# Patient Record
Sex: Female | Born: 1950 | Race: White | Hispanic: No | Smoking: Never smoker
Health system: Southern US, Community
[De-identification: ages and names within clinical notes are randomized; demographics above are authoritative.]

## PROBLEM LIST (undated history)

## (undated) DIAGNOSIS — Z5189 Encounter for other specified aftercare: Secondary | ICD-10-CM

## (undated) DIAGNOSIS — E669 Obesity, unspecified: Secondary | ICD-10-CM

## (undated) DIAGNOSIS — K635 Polyp of colon: Secondary | ICD-10-CM

## (undated) DIAGNOSIS — I10 Essential (primary) hypertension: Secondary | ICD-10-CM

## (undated) DIAGNOSIS — E785 Hyperlipidemia, unspecified: Secondary | ICD-10-CM

## (undated) DIAGNOSIS — H409 Unspecified glaucoma: Secondary | ICD-10-CM

## (undated) DIAGNOSIS — B1910 Unspecified viral hepatitis B without hepatic coma: Secondary | ICD-10-CM

## (undated) DIAGNOSIS — K219 Gastro-esophageal reflux disease without esophagitis: Secondary | ICD-10-CM

## (undated) DIAGNOSIS — F32A Depression, unspecified: Secondary | ICD-10-CM

## (undated) DIAGNOSIS — F329 Major depressive disorder, single episode, unspecified: Secondary | ICD-10-CM

## (undated) DIAGNOSIS — F419 Anxiety disorder, unspecified: Secondary | ICD-10-CM

## (undated) DIAGNOSIS — R197 Diarrhea, unspecified: Secondary | ICD-10-CM

## (undated) HISTORY — PX: ABDOMINAL HYSTERECTOMY: SHX81

## (undated) HISTORY — PX: APPENDECTOMY: SHX54

## (undated) HISTORY — DX: Gastro-esophageal reflux disease without esophagitis: K21.9

## (undated) HISTORY — DX: Encounter for other specified aftercare: Z51.89

## (undated) HISTORY — PX: CHOLECYSTECTOMY: SHX55

## (undated) HISTORY — PX: KNEE SURGERY: SHX244

## (undated) HISTORY — DX: Diarrhea, unspecified: R19.7

## (undated) HISTORY — DX: Hyperlipidemia, unspecified: E78.5

## (undated) HISTORY — PX: NECK SURGERY: SHX720

## (undated) HISTORY — DX: Polyp of colon: K63.5

## (undated) HISTORY — DX: Anxiety disorder, unspecified: F41.9

## (undated) HISTORY — DX: Obesity, unspecified: E66.9

## (undated) HISTORY — PX: ANKLE SURGERY: SHX546

## (undated) HISTORY — DX: Unspecified glaucoma: H40.9

## (undated) HISTORY — PX: TONSILLECTOMY: SUR1361

## (undated) HISTORY — PX: COLONOSCOPY: SHX174

## (undated) HISTORY — PX: CATARACT EXTRACTION W/ INTRAOCULAR LENS IMPLANT: SHX1309

## (undated) HISTORY — DX: Essential (primary) hypertension: I10

---

## 1976-08-06 DIAGNOSIS — Z5189 Encounter for other specified aftercare: Secondary | ICD-10-CM

## 1976-08-06 HISTORY — DX: Encounter for other specified aftercare: Z51.89

## 1998-12-27 ENCOUNTER — Encounter: Payer: Self-pay | Admitting: Internal Medicine

## 1998-12-27 ENCOUNTER — Ambulatory Visit (HOSPITAL_COMMUNITY): Admission: RE | Admit: 1998-12-27 | Discharge: 1998-12-27 | Payer: Self-pay | Admitting: Internal Medicine

## 1999-06-10 ENCOUNTER — Ambulatory Visit (HOSPITAL_COMMUNITY): Admission: RE | Admit: 1999-06-10 | Discharge: 1999-06-10 | Payer: Self-pay | Admitting: Orthopedic Surgery

## 1999-06-10 ENCOUNTER — Encounter: Payer: Self-pay | Admitting: Orthopedic Surgery

## 1999-06-26 ENCOUNTER — Ambulatory Visit (HOSPITAL_COMMUNITY): Admission: RE | Admit: 1999-06-26 | Discharge: 1999-06-27 | Payer: Self-pay | Admitting: *Deleted

## 1999-06-26 ENCOUNTER — Encounter: Payer: Self-pay | Admitting: *Deleted

## 1999-06-27 ENCOUNTER — Encounter: Payer: Self-pay | Admitting: *Deleted

## 1999-08-29 ENCOUNTER — Ambulatory Visit (HOSPITAL_COMMUNITY): Admission: RE | Admit: 1999-08-29 | Discharge: 1999-08-29 | Payer: Self-pay | Admitting: *Deleted

## 1999-08-29 ENCOUNTER — Encounter: Payer: Self-pay | Admitting: *Deleted

## 2002-01-19 ENCOUNTER — Encounter: Payer: Self-pay | Admitting: Internal Medicine

## 2002-01-19 ENCOUNTER — Ambulatory Visit (HOSPITAL_COMMUNITY): Admission: RE | Admit: 2002-01-19 | Discharge: 2002-01-19 | Payer: Self-pay | Admitting: Internal Medicine

## 2004-05-15 ENCOUNTER — Emergency Department (HOSPITAL_COMMUNITY): Admission: EM | Admit: 2004-05-15 | Discharge: 2004-05-15 | Payer: Self-pay | Admitting: Emergency Medicine

## 2004-11-06 ENCOUNTER — Ambulatory Visit (HOSPITAL_COMMUNITY): Admission: RE | Admit: 2004-11-06 | Discharge: 2004-11-06 | Payer: Self-pay | Admitting: Internal Medicine

## 2005-01-10 ENCOUNTER — Ambulatory Visit (HOSPITAL_COMMUNITY): Admission: RE | Admit: 2005-01-10 | Discharge: 2005-01-10 | Payer: Self-pay | Admitting: Internal Medicine

## 2005-01-12 ENCOUNTER — Ambulatory Visit (HOSPITAL_COMMUNITY): Admission: RE | Admit: 2005-01-12 | Discharge: 2005-01-12 | Payer: Self-pay | Admitting: Internal Medicine

## 2005-02-21 ENCOUNTER — Emergency Department (HOSPITAL_COMMUNITY): Admission: EM | Admit: 2005-02-21 | Discharge: 2005-02-21 | Payer: Self-pay | Admitting: Emergency Medicine

## 2005-02-28 ENCOUNTER — Encounter (INDEPENDENT_AMBULATORY_CARE_PROVIDER_SITE_OTHER): Payer: Self-pay | Admitting: Specialist

## 2005-02-28 ENCOUNTER — Observation Stay (HOSPITAL_COMMUNITY): Admission: RE | Admit: 2005-02-28 | Discharge: 2005-03-01 | Payer: Self-pay | Admitting: General Surgery

## 2005-09-25 ENCOUNTER — Ambulatory Visit (HOSPITAL_BASED_OUTPATIENT_CLINIC_OR_DEPARTMENT_OTHER): Admission: RE | Admit: 2005-09-25 | Discharge: 2005-09-25 | Payer: Self-pay | Admitting: Orthopedic Surgery

## 2005-10-18 ENCOUNTER — Ambulatory Visit (HOSPITAL_BASED_OUTPATIENT_CLINIC_OR_DEPARTMENT_OTHER): Admission: RE | Admit: 2005-10-18 | Discharge: 2005-10-18 | Payer: Self-pay | Admitting: Orthopedic Surgery

## 2005-12-05 ENCOUNTER — Encounter: Payer: Self-pay | Admitting: *Deleted

## 2006-08-12 ENCOUNTER — Other Ambulatory Visit: Admission: RE | Admit: 2006-08-12 | Discharge: 2006-08-12 | Payer: Self-pay | Admitting: Internal Medicine

## 2006-08-30 ENCOUNTER — Encounter: Admission: RE | Admit: 2006-08-30 | Discharge: 2006-08-30 | Payer: Self-pay | Admitting: Internal Medicine

## 2006-10-08 ENCOUNTER — Ambulatory Visit (HOSPITAL_COMMUNITY): Admission: RE | Admit: 2006-10-08 | Discharge: 2006-10-08 | Payer: Self-pay | Admitting: Internal Medicine

## 2007-03-07 ENCOUNTER — Emergency Department (HOSPITAL_COMMUNITY): Admission: EM | Admit: 2007-03-07 | Discharge: 2007-03-07 | Payer: Self-pay | Admitting: Emergency Medicine

## 2007-11-25 ENCOUNTER — Ambulatory Visit: Payer: Self-pay | Admitting: Gastroenterology

## 2007-11-25 LAB — CONVERTED CEMR LAB
ALT: 67 units/L — ABNORMAL HIGH (ref 0–35)
AST: 53 units/L — ABNORMAL HIGH (ref 0–37)
Albumin: 3.9 g/dL (ref 3.5–5.2)
Alkaline Phosphatase: 74 units/L (ref 39–117)
BUN: 14 mg/dL (ref 6–23)
Basophils Absolute: 0.1 10*3/uL (ref 0.0–0.1)
Basophils Relative: 1.3 % — ABNORMAL HIGH (ref 0.0–1.0)
Bilirubin, Direct: 0.1 mg/dL (ref 0.0–0.3)
CO2: 30 meq/L (ref 19–32)
Calcium: 9.7 mg/dL (ref 8.4–10.5)
Chloride: 100 meq/L (ref 96–112)
Creatinine, Ser: 0.9 mg/dL (ref 0.4–1.2)
Eosinophils Absolute: 0.2 10*3/uL (ref 0.0–0.7)
Eosinophils Relative: 1.9 % (ref 0.0–5.0)
GFR calc Af Amer: 83 mL/min
GFR calc non Af Amer: 69 mL/min
Glucose, Bld: 195 mg/dL — ABNORMAL HIGH (ref 70–99)
HCT: 40.2 % (ref 36.0–46.0)
Hemoglobin: 13.4 g/dL (ref 12.0–15.0)
Lymphocytes Relative: 39.5 % (ref 12.0–46.0)
MCHC: 33.3 g/dL (ref 30.0–36.0)
MCV: 85.8 fL (ref 78.0–100.0)
Monocytes Absolute: 0.6 10*3/uL (ref 0.1–1.0)
Monocytes Relative: 5.3 % (ref 3.0–12.0)
Neutro Abs: 5.6 10*3/uL (ref 1.4–7.7)
Neutrophils Relative %: 52 % (ref 43.0–77.0)
Platelets: 307 10*3/uL (ref 150–400)
Potassium: 3.9 meq/L (ref 3.5–5.1)
RBC: 4.69 M/uL (ref 3.87–5.11)
RDW: 13.3 % (ref 11.5–14.6)
Sodium: 139 meq/L (ref 135–145)
TSH: 2.14 microintl units/mL (ref 0.35–5.50)
Total Bilirubin: 0.5 mg/dL (ref 0.3–1.2)
Total Protein: 7.3 g/dL (ref 6.0–8.3)
WBC: 10.8 10*3/uL — ABNORMAL HIGH (ref 4.5–10.5)

## 2007-12-09 ENCOUNTER — Encounter: Payer: Self-pay | Admitting: Gastroenterology

## 2007-12-09 ENCOUNTER — Ambulatory Visit: Payer: Self-pay | Admitting: Gastroenterology

## 2007-12-09 LAB — CONVERTED CEMR LAB
A-1 Antitrypsin, Ser: 119 mg/dL (ref 83–200)
ALT: 82 units/L — ABNORMAL HIGH (ref 0–35)
AST: 83 units/L — ABNORMAL HIGH (ref 0–37)
Albumin: 3.7 g/dL (ref 3.5–5.2)
Alkaline Phosphatase: 78 units/L (ref 39–117)
Anti Nuclear Antibody(ANA): NEGATIVE
Bilirubin, Direct: 0.2 mg/dL (ref 0.0–0.3)
Ceruloplasmin: 37 mg/dL (ref 21–63)
Ferritin: 40.3 ng/mL (ref 10.0–291.0)
HCV Ab: NEGATIVE
Hep A Total Ab: NEGATIVE
Hep B S Ab: POSITIVE — AB
Hepatitis B Surface Ag: NEGATIVE
INR: 1.1 — ABNORMAL HIGH (ref 0.8–1.0)
Iron: 55 ug/dL (ref 42–145)
Prothrombin Time: 12.6 s (ref 10.9–13.3)
Saturation Ratios: 12.9 % — ABNORMAL LOW (ref 20.0–50.0)
Total Bilirubin: 0.8 mg/dL (ref 0.3–1.2)
Total Protein: 6.8 g/dL (ref 6.0–8.3)
Transferrin: 304.1 mg/dL (ref >=212.0)

## 2007-12-12 ENCOUNTER — Encounter: Payer: Self-pay | Admitting: Gastroenterology

## 2007-12-15 ENCOUNTER — Encounter: Payer: Self-pay | Admitting: Gastroenterology

## 2007-12-23 ENCOUNTER — Ambulatory Visit (HOSPITAL_COMMUNITY): Admission: RE | Admit: 2007-12-23 | Discharge: 2007-12-23 | Payer: Self-pay | Admitting: Gastroenterology

## 2008-01-30 ENCOUNTER — Telehealth (INDEPENDENT_AMBULATORY_CARE_PROVIDER_SITE_OTHER): Payer: Self-pay | Admitting: *Deleted

## 2008-10-25 ENCOUNTER — Encounter: Admission: RE | Admit: 2008-10-25 | Discharge: 2009-01-23 | Payer: Self-pay | Admitting: Internal Medicine

## 2008-12-17 IMAGING — US US ABDOMEN COMPLETE
1 series · 14 of 25 positions shown · non-contrast
Comparison: The CT abdomen 01/12/2005 the

CLINICAL DATA: Abnormal liver function test

ABDOMEN ULTRASOUND
TECHNIQUE: Complete abdominal ultrasound examination was performed
including evaluation of the liver, gallbladder, bile ducts,
pancreas, kidneys, spleen, IVC, and abdominal aorta.

[Series 1: unknown · 0.33mm/px · 14 of 51 slices shown]
[im 1/51]
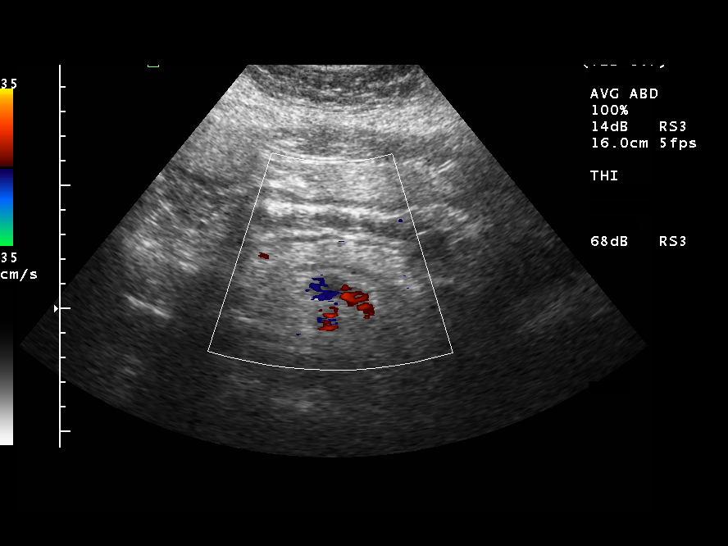
[im 5/51]
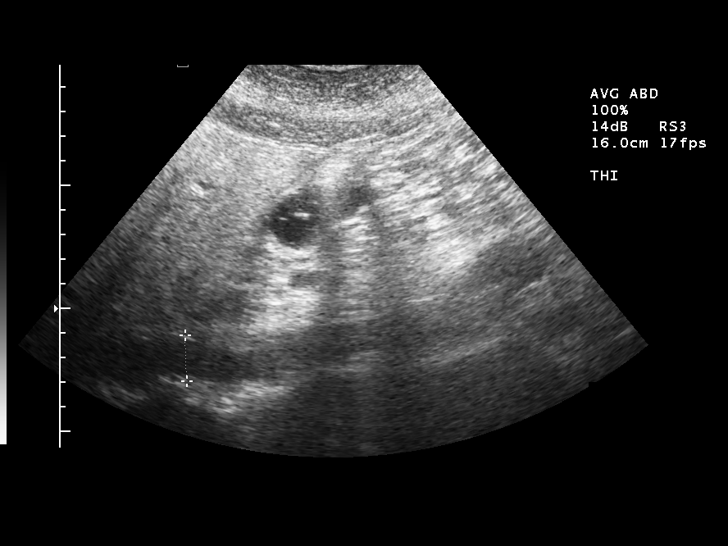
[im 9/51]
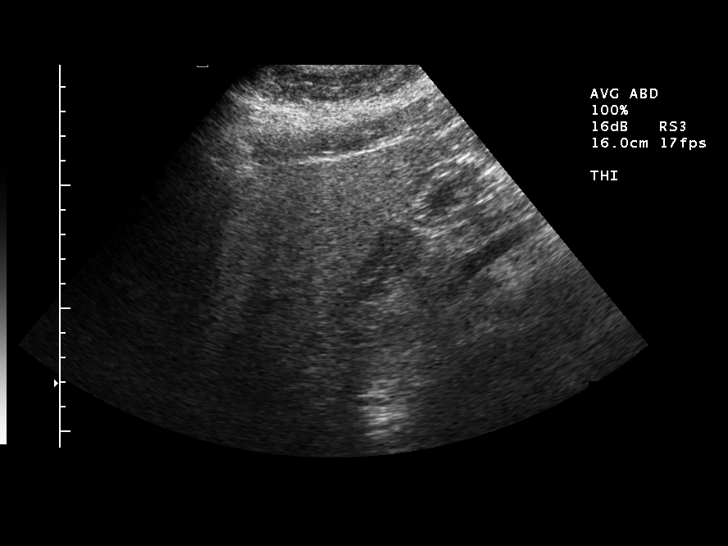
[im 13/51]
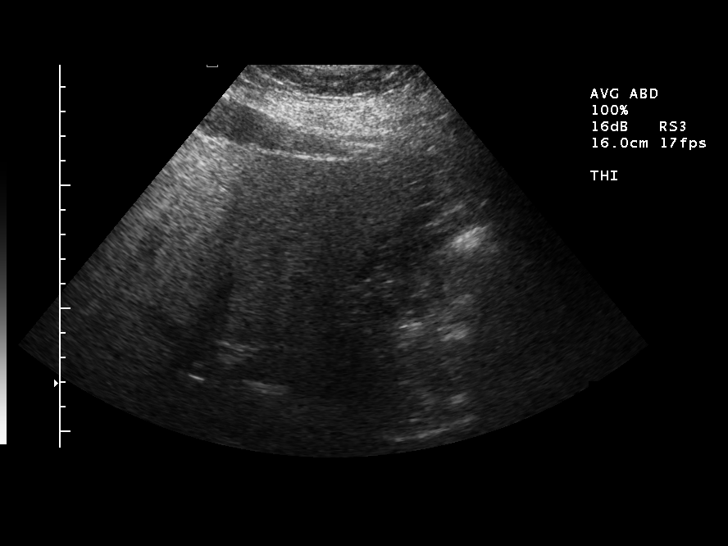
[im 17/51]
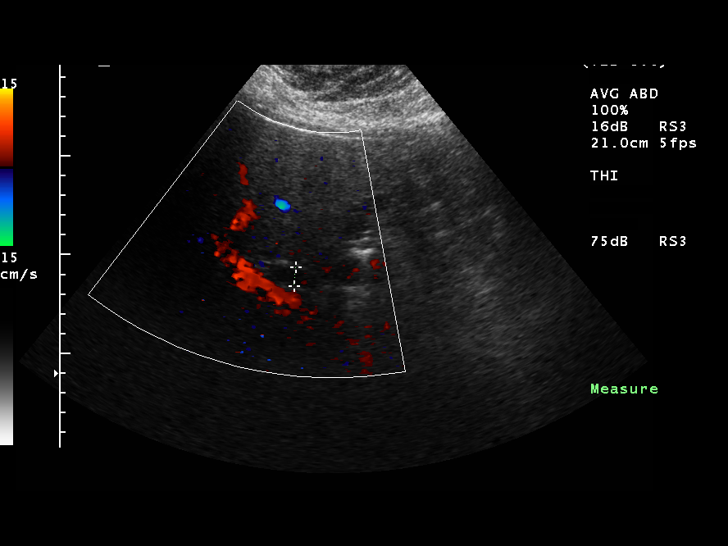
[im 19/51]
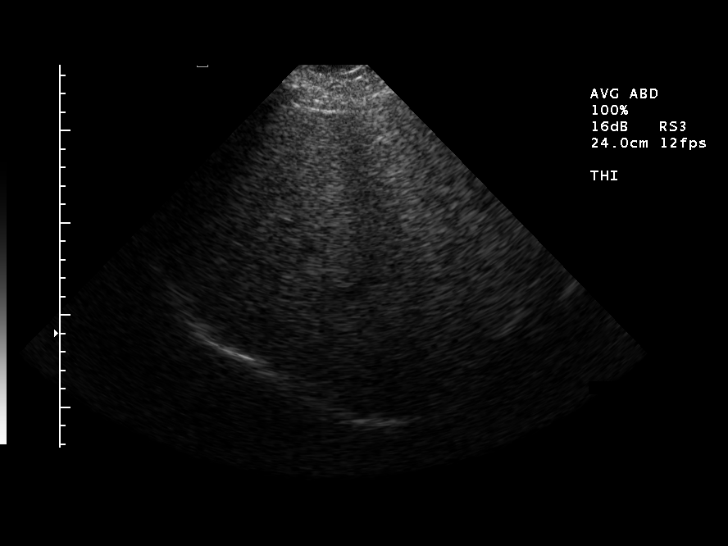
[im 23/51]
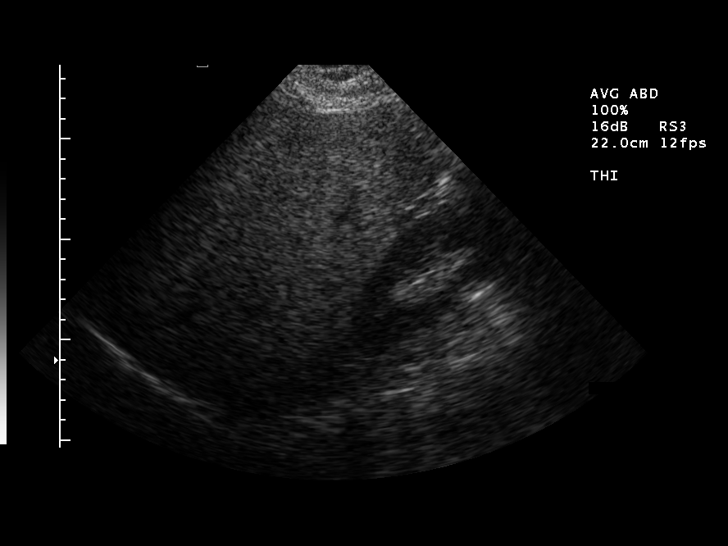
[im 28/51]
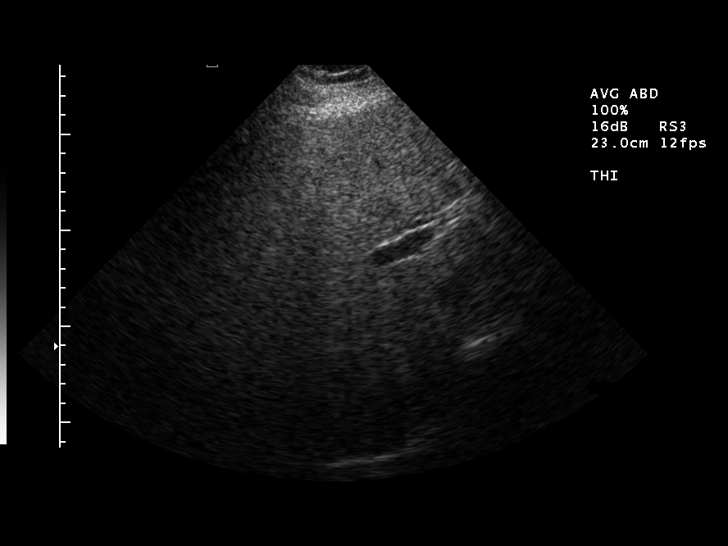
[im 32/51]
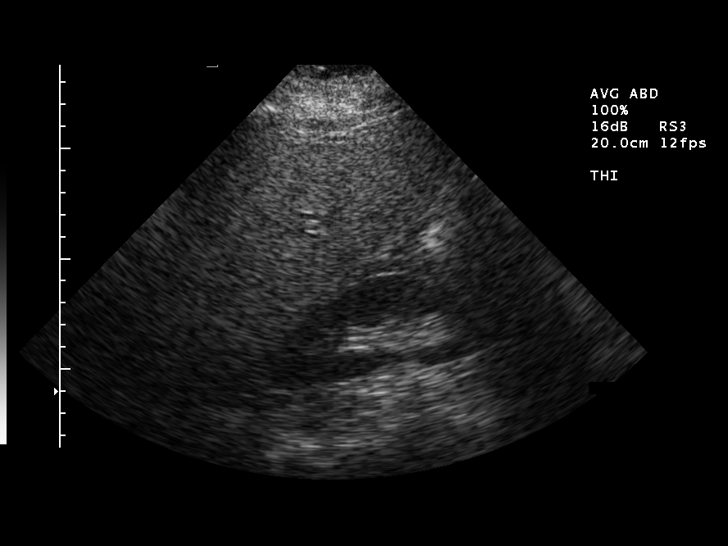
[im 34/51]
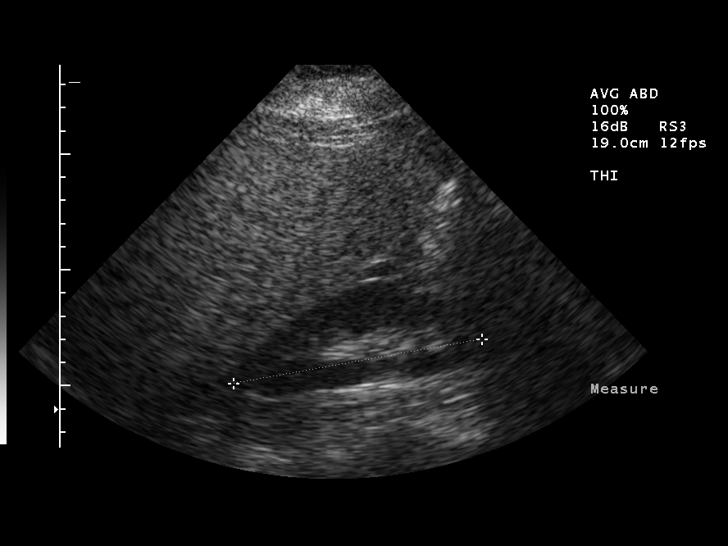
[im 38/51]
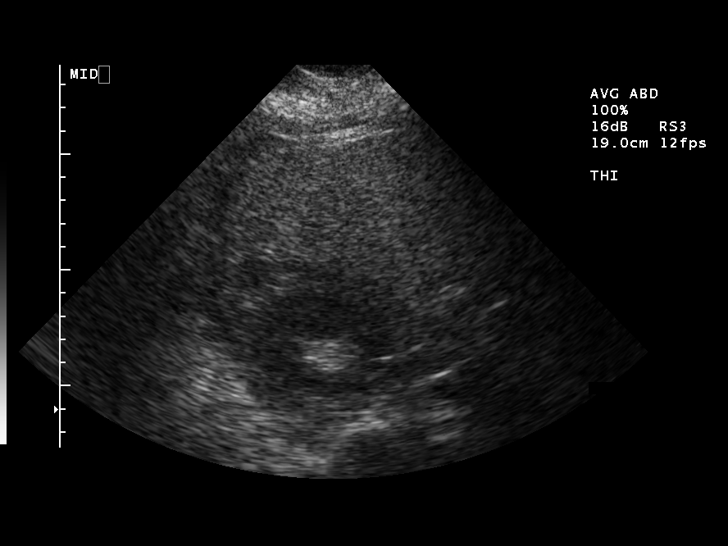
[im 42/51]
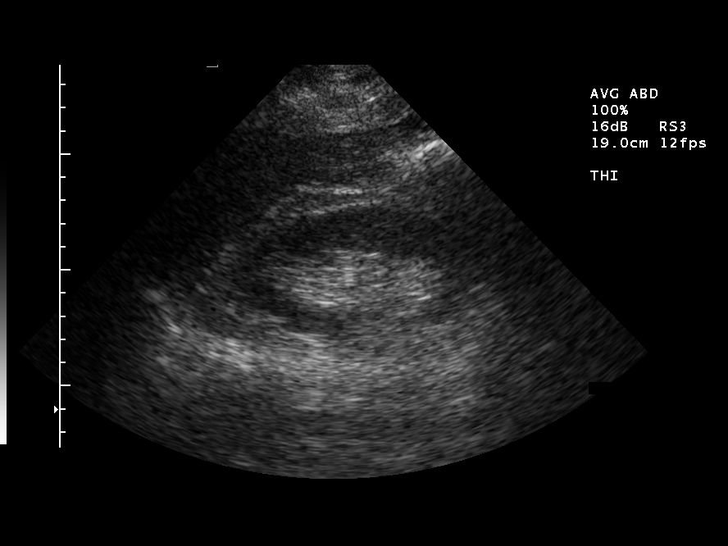
[im 46/51]
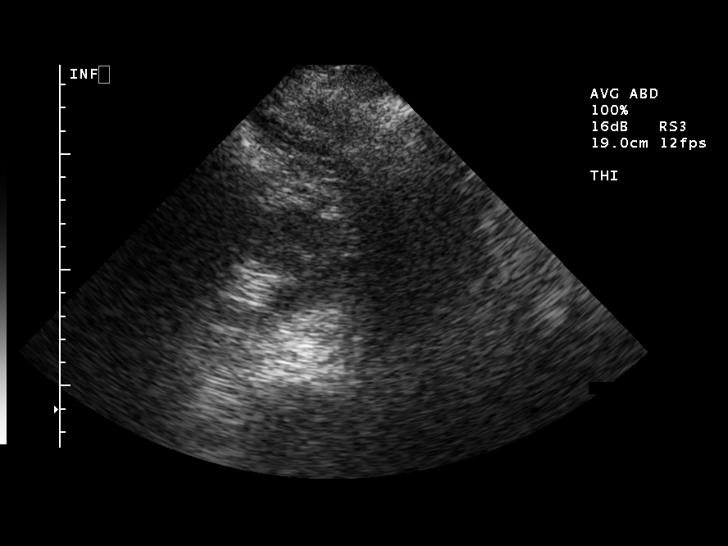
[im 51/51]
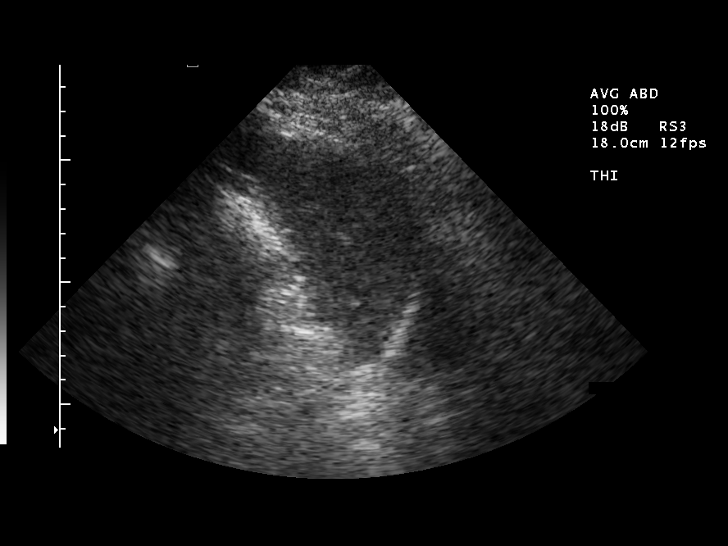

[14 of 25 positions shown; findings below may reference images not displayed]

FINDINGS: Liver demonstrates diffuse increased echogenicity.  No
focal mass identified.  The gallbladder is absent.  Common bile
duct measures 9.6 cm.

The IVC is normal.

The pancreas has no focal mass.  The pancreatic duct measures 3 mm.

Spleen is normal 10.1 cm.

Right kidney measures 10.9 cm.  Left kidney measures 11.1 cm.  No
evidence of hydronephrosis.

Abdominal aorta normal diameter 1.9 cm
IMPRESSION: 1.  Diffusely increased liver echogenicity commonly represents
hepatic steotosis versus hepatocelluar disease.

## 2009-03-24 ENCOUNTER — Ambulatory Visit (HOSPITAL_COMMUNITY): Admission: RE | Admit: 2009-03-24 | Discharge: 2009-03-24 | Payer: Self-pay | Admitting: Internal Medicine

## 2010-03-01 ENCOUNTER — Encounter
Admission: RE | Admit: 2010-03-01 | Discharge: 2010-05-04 | Payer: Self-pay | Source: Home / Self Care | Admitting: Internal Medicine

## 2010-06-01 ENCOUNTER — Emergency Department (HOSPITAL_COMMUNITY): Admission: EM | Admit: 2010-06-01 | Discharge: 2010-06-02 | Payer: Self-pay | Admitting: Emergency Medicine

## 2010-08-27 ENCOUNTER — Encounter: Payer: Self-pay | Admitting: Gastroenterology

## 2010-12-19 NOTE — Assessment & Plan Note (Signed)
[facility]                         GASTROENTEROLOGY OFFICE NOTE   CHRISTEN, WARDROP                          MRN:          [id number]  DATE:11/25/2007                            DOB:          [birth year]    REASON FOR REFERRAL:  Chronic diarrhea.   Ms. Dlugosz is a very pleasant 60 year old diabetic woman who has had at  least 2-3 years of chronic loose stools.  Shortly after her gallbladder  was removed laparoscopically for biliary colic and gallstones as the  time that her loose stools began.  She feels that they are generally  green, usually very loose and watery.  She only very intermittently sees  a small amount of blood on the tissue paper.  She has not had nocturnal  diarrhea but has had fecal incontinence on more than 1 occasion.  She  said Imodium works when she takes it and those are only on particularly  bad loose days.   She had recent blood tests performed showing an essentially normal CBC  except for white count of 12,000.  Her microalbumin was 7 which is much  higher than normal.  Complete metabolic profile was normal except for a  slightly elevated transaminases.  Liver testing were  otherwise normal.  Her AST was 38, her ALT was 53, TSH was normal and her hemoglobin A1c  was over 10.  She had never had colonoscopy.   REVIEW OF SYSTEMS:  Notable for a 15 pound weight gain in the past year.   PAST MEDICAL HISTORY:  1. Diabetes requiring insulin.  2. Hypertension.  3. Elevated cholesterol.  4. Status post lap chole 2006.  5. Status post hysterectomy in the 1980s.   CURRENT MEDICINES:  Fortamet, pravastatin, Hyzaar, Humulin, Lantus,  aspirin, vitamin C, multivitamin, MegaRed Omega, vitamin D.   ALLERGIES:  No known drug allergies.   SOCIAL HISTORY:  Married with 2 children.  Nonsmoker, nondrinker.  Drinks 3-4 caffeinated beverages a day, coffee in the morning.   FAMILY HISTORY:  Most of her family has diabetes.  No colon cancer  in  her family.   PHYSICAL EXAMINATION:  Height 5 feet 3 inches, 244 pounds.  Blood  pressure 128/74.  Pulse 64.  CONSTITUTIONAL:  Morbidly obese otherwise well-appearing.  NEUROLOGIC:  Alert and oriented x3.  EYES:  Extraocular movements intact.  MOUTH:  Oropharynx moist, no lesions.  NECK:  No lymphadenopathy.  CARDIOVASCULAR:  Heart regular rate and rhythm.  LUNGS:  Clear to auscultation bilaterally.  ABDOMEN:  Soft, nontender, nondistended.  Normal bowel sounds.  EXTREMITIES:  No lower extremity edema.  SKIN:  No rashes or lesion of visible extremities.   ASSESSMENT AND PLAN:  A 60 year old poorly controlled diabetic status  post laparoscopic cholecystectomy in 2006 with chronic loose stools.   She points to the day of her cholecystectomy as the time that her loose  stools began.  This is not an uncommon side effect of having one's  gallbladder removed.  Cholestyramine is usually very effective at  controlling this type of diarrhea.  Diabetics also can have a relative  neuropathic diarrhea.  They can also have small bowel bacterial  overgrowth which can cause diarrhea.  She could certainly have an  inflammatory or neoplastic process contributing or causing her diarrhea  but I think those are unlikely.  My plan is to proceed with full  colonoscopy at her earliest convenience, also repeat some blood tests  including CBC, complete metabolic profile.  If colonoscopy is  essentially normal then I will likely get her started on a  cholestyramine trial.     Rachael Fee, MD  Electronically Signed    DPJ/MedQ  DD: 11/25/2007  DT: 11/25/2007  Job #: [id number]   cc:   Lucky Cowboy, M.D.

## 2010-12-22 NOTE — Op Note (Signed)
NAME:  Ellen Mccoy, Ellen Mccoy NO.:  [id number]   MEDICAL RECORD NO.:  [id number]          PATIENT TYPE:  AMB   LOCATION:  DAY                          FACILITY:  [facility]   PHYSICIAN:  Gita Kudo, M.D. DATE OF BIRTH:  1950/10/18   DATE OF PROCEDURE:  02/28/2005  DATE OF DISCHARGE:                                 OPERATIVE REPORT   OPERATIVE PROCEDURE:  Laparoscopic cholecystectomy.   SURGEON:  Gita Kudo, M.D.   ASSISTANT:  Angelia Mould. Derrell Lolling, M.D.   ANESTHESIA:  General endotracheal.   PREOPERATIVE DIAGNOSIS:  Gallstones.   POSTOPERATIVE DIAGNOSIS:  Gallstones, normal cholangiogram.   CLINICAL SUMMARY:  A 60 year old overweight diabetic lady with gallstones  and normal liver function studies brought in for elective cholecystectomy.   OPERATIVE FINDINGS:  The gallbladder was thin walled. The anatomy of the  duct and artery were normal. Cholangiogram looked normal.   OPERATIVE PROCEDURE:  Under satisfactory general endotracheal anesthesia,  having received 1.0 grams Ancef preop, the patient was positioned, prepped  and draped in the standard fashion. A total of 28 mL of 0.5% Marcaine with  epinephrine was infiltrated at the skin incision sites for postop analgesia.  Transverse incision made above the umbilicus, midline opened into the  peritoneum and controlled with a figure-of-eight 0 Vicryl suture. Operating  Hussan  port inserted, secured and good CO2 pneumoperitoneum established.  Under direct vision, two #5 ports placed laterally and a second #10  medially. Lateral graspers gave excellent exposure and operating through the  medial port, we took down the adhesions to the gallbladder,  circumferentially dissected the cystic duct and cystic artery. When certain  of the anatomy, the artery was divided between multiple clips and a single  clip placed on the duct near the gallbladder. Incision made in the duct,  percutaneous catheter placed and  cholangiogram obtained. It looked good  without filling defects or obstruction. Then the gallbladder was dissected  from the liver bed from below upward. Cautery was used for hemostasis and  dissection. After the gallbladder was amputated, a grasper was placed  through the umbilicus and the camera moved to the upper port and the  gallbladder removed, intact and without spillage or complication. The  operative site was lavaged with saline, made hemostatic by cautery and then  suctioned  dry. The ports and CO2 were released. Following this, the midline closed  with a previous figure-of-eight and a second 0 Vicryl suture. Subcu  approximated with 4-0 Vicryl and Steri-Strips approximated the skin. There  were no complications and the sponge and needle counts were correct.       MRL/MEDQ  D:  02/28/2005  T:  02/28/2005  Job:  [id number]   cc:   Lucky Cowboy, M.D.  [address]  [place], [place] [address]  Fax: [phone]

## 2010-12-22 NOTE — Op Note (Signed)
NAME:  Ellen Mccoy, ARRIGHI NO.:  [id number]   MEDICAL RECORD NO.:  [id number]          PATIENT TYPE:  AMB   LOCATION:  DSC                          FACILITY:  [facility]   PHYSICIAN:  Katy Fitch. Sypher, M.D. 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   OPERATIONS:  Release of left transverse carpal ligament.   OPERATING SURGEON:  Molly Maduro Sypher   ASSISTANT:  Molly Maduro Dasnoit PA-C.   ANESTHESIA:  General by LMA.   SUPERVISING ANESTHESIOLOGIST:  Dr. Gelene Mink.   INDICATIONS:  [name] is a 60 year old woman referred through the  courtesy of Dr. Oneta Rack for evaluation and management of bilateral hand  numbness. Clinical examination suggested carpal tunnel syndrome.   Electrodiagnostic studies completed by Dr. Wadie Lessen revealed significant  bilateral carpal tunnel syndrome, worse on the left than the right.   After informed consent at the office, she is brought to the operating at  this time for release of her left transverse carpal ligament.   PROCEDURE:  [name] was brought to the operating room and placed in  supine position on the table.   Following the induction of general anesthesia by LMA technique, the left arm  was prepped with Betadine soap solution, sterilely draped.   Following exsanguination of left arm with an Esmarch bandage, an arterial  tourniquet proximal brachium inflated to 220 mmHg. During the course of  procedure, her systolic blood pressure increased to over 160 mmHg. Therefore  the tourniquet was ultimately elevated to 270 mmHg.   Arsenio Loader was brought to operating room and placed in supine position on  the table.   Following induction general anesthesia by LMA technique, the left arm  was  prepped with Betadine soap and solution, sterilely draped. Following  exsanguination of left arm with Esmarch bandage, the arterial time was  inflated to 220 mmHg and later elevated as mentioned to 270 mmHg.   Procedure commenced with a short incision in the line of the ring finger in  the palm. The subcutaneous tissues were carefully divided to reveal the  palmar fascia. This was split longitudinally to reveal the common sensory  branch of median nerve. These were followed back to transverse carpal  ligament proper which was carefully isolated from the median nerve with a  Insurance risk surveyor.   Ligament released along its ulnar border extending into the distal forearm.  The volar forearm fascia was released subcutaneously.   This widely opened carpal canal. No mass or other predicaments were noted.   Bleeding points along the margin of the released ligament were controlled by  direct pressure. The wound was repaired with intradermal 3-0 Prolene. A  compressive dressing was applied with a volar plaster splint maintaining the  wrist in 5 degrees of dorsiflexion.      Katy Fitch Sypher,  M.D.  Electronically Signed     RVS/MEDQ  D:  09/25/2005  T:  09/25/2005  Job:  [id number]

## 2010-12-22 NOTE — Op Note (Signed)
NAME:  Ellen Mccoy, Ellen Mccoy NO.:  [id number]   MEDICAL RECORD NO.:  [id number]          PATIENT TYPE:  AMB   LOCATION:  DSC                          FACILITY:  [facility]   PHYSICIAN:  Katy Fitch. Sypher, M.D. 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   POSTOP DIAGNOSIS:  Chronic median neuropathy right wrist at carpal tunnel  level secondary to type 2 diabetes and chronic entrapment neuropathy.   OPERATION:  Release of right transverse carpal ligament.   OPERATING SURGEON:  Josephine Igo, MD   ASSISTANT:  Annye Rusk PA-C.   ANESTHESIA:  General by LMA.   SUPERVISING ANESTHESIOLOGIST:  Dr. Isidor Holts.   INDICATIONS:  Ellen Mccoy is a 60 year old woman referred through the  courtesy of Dr. Marlowe Shores for evaluation and management of hand pain and  numbness. She was seen for detailed evaluation in February 2006 and on  September 12, 2004 had detailed electrodiagnostic studies by Dr. Wadie Lessen.  These documented very severe left carpal tunnel syndrome and moderately  severe right carpal tunnel syndrome.   She is status post release of her left transverse carpal ligament with an  early satisfactory result.   She requested similar surgery be performed on the right.  Preoperatively  questions were invited and answered.   PROCEDURE:  [name] is brought to the operating room and placed in  supine position on the operating table.   Following induction of general anesthesia by LMA technique. The right arm  was prepped with Betadine soap solution, sterilely draped. A pneumatic  tourniquet was applied to the proximal brachium.   Following exsanguination of right arm with an Esmarch bandage, the arterial  tourniquet  inflated to 280 mmHg due to significant systolic hypertension in  the range of 160-170 mmHg.   The procedure commenced with short incision in line the ring finger in the  palm. The subcutaneous tissues were carefully divided to reveal the palmar  fascia. This was split longitudinally to reveal the common sensory branch of  the median nerve.   These were followed back to the transverse carpal ligament which was gently  isolated from median nerve.   Ligament released along its ulnar border extending into the distal forearm.   This widely opened carpal canal. The masses or other predicaments were  noted. Bleeding points along the margin of the released ligament were  electrocauterized with bipolar current followed by repair the skin with  intradermal 3-0 Prolene suture.   A compressive dressing was applied with a volar plaster splint maintaining  the wrist in 5 degrees of dorsiflexion.      Katy Fitch Sypher, M.D.  Electronically Signed     RVS/MEDQ  D:  10/18/2005  T:  10/19/2005  Job:  [id number]   cc:   Lucky Cowboy, M.D.  Fax: [phone]

## 2010-12-22 NOTE — Op Note (Signed)
[place]  Patient:    Ellen Mccoy                           MRN: [id number] Proc. Date: 06/26/99 Adm. Date:  [id number] Attending:  Evonnie Dawes CC:         Marinus Maw, M.D.                           Operative Report  PREOPERATIVE DIAGNOSIS:  Herniated nucleus pulposus, right C4-C5 and C5-C6.  SUPPLEMENTARY DIAGNOSES: Diabetes mellitus, type 2, treated with oral hypoglycemic agent with multiple previous orthopedic surgeries without difficulty.  He is a nonsmoker and nondrinker.  PROCEDURE: 1. C4-C5 and C5-C6 anterior diskectomies. 2. Carpectomy, C5. 3. Harvesting of vertebral body bone for autograph fusion.4 4. Strut graft, fibular allograft, lyophilized bone, placement C6 to C4. 5. Fixation with 38 mm Codman titanium plate with 15 mm screws x 5 and    one left mid 12 mm screw into the bone plug.  SURGEON: Ricky D. Gasper Sells, M.D.  ASSISTANT: Harvie Junior, M.D.  ANESTHESIA: General, controlled.  Instrument count, sponge count and needle count correct.  COMPLICATIONS:  None.  INDICATIONS:  This is a 60 year old white right-handed female with a two-week history of severe neck and right arm pain.  When I saw her in the office on January 17 she had numbness and tingling, dense, involving the thumb and index finger on the left side.  Her right biceps was sore and weak as was the deltoid and supraspinatus on that side.  She had a recent MRI which showed an HNP at C4-C5 and C5-C6 on the right consistent with her neurological.  Because the shoulder weakness was quite profound, it was decided to do her urgently.  This was taken care of today with some difficulty getting in because of her short, thick neck, but the actual diskectomy procedure was uncomplicated as was the fixation with the plates.  The patients own bone was mixed with osteoblast, a new form of bone gel with lyophilized bone chips in it  mixed with the  patients own bone laterally at C4-C5 and C5-C6 to accomplish the  actual fusion using the strut and the plate for internal fixation.  DESCRIPTION OF PROCEDURE:  With the patient under general endotracheal anesthesia the patient was positioned with the neck slightly extended in the supine position. A bearhugger blanket was in place, PAS hose were in place and all pressure points were carefully inspected and padded.  Following this the patient was draped appropriately and an incision was made in the left anterior triangle of the neck approximately 4 cm long in a convenient skin  fold.  Subcutaneous hemostasis was achieved with unipolar coagulation.  The platysma was identified and divided along its fibers and retracted medially and  laterally with a total of four fish hooks.  A plane was developed down to the carotid artery where a right-hand turn was made to encounter the anterior cervical spine.  All of this was quite difficult because she was so large and her neck was rather short and stubby.  A transverse vein at the C5-C6 level was identified, coagulated and divided.  The assumption was made that the C6 vertebral body was  right there.  That turned out to be true.  A Caspar fixation pin was placed in he vertebral body of C4 and C6.  Retractors were then placed medially and laterally and, having dissected the longus coli muscles free of the anterior cervical spine at that level and having done a control x-ray to confirm the level exposing the C4 through C6 levels anteriorly in that fashion.  A high speed drill was then used to drill off the osteophytes at C4-C5 and C5-C6 and then diskectomies were performed. The high speed drill was then used to drill off the cartilage end plate at Z6-X0 and C5-C6 and a carpectomy was performed with a Lexell instrument, trimming up he lateral margins with the high-speed drill.  The microscope was then brought in and, starting at the  C4-C5 level, the annulus and the posterior longitudinal ligament were removed, thereby removing the interligamentous disk rupture.  Disk material was found indenting the dural sac  laterally.  This was removed.  A C5-C4 ______ was performed on the right. Exactly the same procedure was carried out at C5-C6 except there was probably a slightly larger disk rupture at that level, interligamentous, and also a fragment which as extradural.  A custom-made fibular human lyophilized bone strut was then formed and pounded into place, keying it into the vertebral body of C4 and C6.  Laterally t the C4-C5 and C5-C6 levels the bone was roughened up with a high-speed drill and packed with lyophilized bone gel and the patients own bone.  Then a 38 mm Codman plate was placed over the anterior cervical spine and held in place with a total of five 15 mm screws which were then locked in place and on 12 mm screw at the left lateral middle screw hole.  Control x-ray was done to confirm the appropriate levels were done and they were. The wound was irrigated with Betadine solution and hydrogen peroxide.  The self-retaining retractors were removed and the wound was packed with 4 x 4 and hen irrigated with hydrogen peroxide and no bleeding was encountered.  The wound was completely dry.  The platysma was closed with three interrupted 2-0 Vicryl sutures in the platysmal muscle.  Subcutaneous closure was carried out with 4-0 Vicryl inverted interrupted and then Steri-Strips in the skin.  A dry dressing was applied.  The patient tolerated the procedure well and awoke  from surgery unremarkably, moving all four extremities, in particular the lower  extremities complaining of no pain in the right arm. DD:  06/26/99 TD:  06/26/99 Job: [id number]

## 2011-02-01 ENCOUNTER — Encounter: Payer: Self-pay | Admitting: Gastroenterology

## 2011-04-08 ENCOUNTER — Emergency Department (HOSPITAL_COMMUNITY): Payer: No Typology Code available for payment source

## 2011-04-08 ENCOUNTER — Emergency Department (HOSPITAL_COMMUNITY)
Admission: EM | Admit: 2011-04-08 | Discharge: 2011-04-09 | Disposition: A | Discharge status: Home / Self Care | Payer: No Typology Code available for payment source | Attending: Emergency Medicine | Admitting: Emergency Medicine

## 2011-04-08 DIAGNOSIS — M25519 Pain in unspecified shoulder: Secondary | ICD-10-CM | POA: Insufficient documentation

## 2011-04-08 DIAGNOSIS — Y9289 Other specified places as the place of occurrence of the external cause: Secondary | ICD-10-CM | POA: Insufficient documentation

## 2011-04-08 DIAGNOSIS — E119 Type 2 diabetes mellitus without complications: Secondary | ICD-10-CM | POA: Insufficient documentation

## 2011-04-08 DIAGNOSIS — M542 Cervicalgia: Secondary | ICD-10-CM | POA: Insufficient documentation

## 2011-04-08 DIAGNOSIS — Z794 Long term (current) use of insulin: Secondary | ICD-10-CM | POA: Insufficient documentation

## 2011-04-08 DIAGNOSIS — F411 Generalized anxiety disorder: Secondary | ICD-10-CM | POA: Insufficient documentation

## 2011-04-08 DIAGNOSIS — Z79899 Other long term (current) drug therapy: Secondary | ICD-10-CM | POA: Insufficient documentation

## 2011-04-08 DIAGNOSIS — E669 Obesity, unspecified: Secondary | ICD-10-CM | POA: Insufficient documentation

## 2011-04-08 DIAGNOSIS — S139XXA Sprain of joints and ligaments of unspecified parts of neck, initial encounter: Secondary | ICD-10-CM | POA: Insufficient documentation

## 2011-05-21 LAB — BASIC METABOLIC PANEL
BUN: 7
CO2: 29
Calcium: 9.8
Chloride: 98
Creatinine, Ser: 0.88
GFR calc Af Amer: >60
GFR calc non Af Amer: >60
Glucose, Bld: 326 — ABNORMAL HIGH
Potassium: 3.7
Sodium: 137

## 2011-05-21 LAB — POCT CARDIAC MARKERS
CKMB, poc: 1.2
CKMB, poc: <1 — ABNORMAL LOW
Myoglobin, poc: 48.8
Myoglobin, poc: 52
Operator id: 4295
Operator id: 4295
Troponin i, poc: <0.05
Troponin i, poc: <0.05

## 2011-05-21 LAB — CBC
HCT: 41.5
Hemoglobin: 14.1
MCHC: 34
MCV: 83.4
Platelets: 322
RBC: 4.98
RDW: 13.8
WBC: 9.4

## 2011-05-21 LAB — D-DIMER, QUANTITATIVE: D-Dimer, Quant: <0.22

## 2011-05-21 LAB — DIFFERENTIAL
Basophils Absolute: 0.2 — ABNORMAL HIGH
Basophils Relative: 2 — ABNORMAL HIGH
Eosinophils Absolute: 0.1
Eosinophils Relative: 2
Lymphocytes Relative: 28
Lymphs Abs: 2.6
Monocytes Absolute: 0.7
Monocytes Relative: 8
Neutro Abs: 5.7
Neutrophils Relative %: 61

## 2012-01-01 ENCOUNTER — Encounter (HOSPITAL_COMMUNITY): Payer: Self-pay | Admitting: *Deleted

## 2012-01-01 ENCOUNTER — Emergency Department (HOSPITAL_COMMUNITY)
Admission: EM | Admit: 2012-01-01 | Discharge: 2012-01-01 | Disposition: A | Discharge status: Home / Self Care | Payer: BC Managed Care – PPO | Attending: Emergency Medicine | Admitting: Emergency Medicine

## 2012-01-01 ENCOUNTER — Emergency Department (HOSPITAL_COMMUNITY): Payer: BC Managed Care – PPO

## 2012-01-01 DIAGNOSIS — Y92009 Unspecified place in unspecified non-institutional (private) residence as the place of occurrence of the external cause: Secondary | ICD-10-CM | POA: Insufficient documentation

## 2012-01-01 DIAGNOSIS — M545 Low back pain, unspecified: Secondary | ICD-10-CM | POA: Insufficient documentation

## 2012-01-01 DIAGNOSIS — Z79899 Other long term (current) drug therapy: Secondary | ICD-10-CM | POA: Insufficient documentation

## 2012-01-01 DIAGNOSIS — E119 Type 2 diabetes mellitus without complications: Secondary | ICD-10-CM | POA: Insufficient documentation

## 2012-01-01 DIAGNOSIS — W1809XA Striking against other object with subsequent fall, initial encounter: Secondary | ICD-10-CM | POA: Insufficient documentation

## 2012-01-01 DIAGNOSIS — W19XXXA Unspecified fall, initial encounter: Secondary | ICD-10-CM

## 2012-01-01 DIAGNOSIS — F329 Major depressive disorder, single episode, unspecified: Secondary | ICD-10-CM | POA: Insufficient documentation

## 2012-01-01 DIAGNOSIS — Z981 Arthrodesis status: Secondary | ICD-10-CM | POA: Insufficient documentation

## 2012-01-01 DIAGNOSIS — F3289 Other specified depressive episodes: Secondary | ICD-10-CM | POA: Insufficient documentation

## 2012-01-01 DIAGNOSIS — R51 Headache: Secondary | ICD-10-CM | POA: Insufficient documentation

## 2012-01-01 HISTORY — DX: Unspecified viral hepatitis B without hepatic coma: B19.10

## 2012-01-01 HISTORY — DX: Major depressive disorder, single episode, unspecified: F32.9

## 2012-01-01 HISTORY — DX: Depression, unspecified: F32.A

## 2012-01-01 MED ORDER — OXYCODONE-ACETAMINOPHEN 5-325 MG PO TABS
1.0000 | ORAL_TABLET | Freq: Once | ORAL | Status: AC
  Administered 2012-01-01: 1 via ORAL
  Filled 2012-01-01: qty 1

## 2012-01-01 MED ORDER — HYDROCODONE-ACETAMINOPHEN 5-325 MG PO TABS: 1.0000 | ORAL_TABLET | ORAL | Status: AC | PRN

## 2012-01-01 MED ORDER — DIAZEPAM 5 MG/ML IJ SOLN
5.0000 mg | Freq: Once | INTRAMUSCULAR | Status: AC
  Administered 2012-01-01: 5 mg via INTRAMUSCULAR
  Filled 2012-01-01: qty 2

## 2012-01-01 MED ORDER — KETOROLAC TROMETHAMINE 30 MG/ML IJ SOLN
60.0000 mg | Freq: Once | INTRAMUSCULAR | Status: AC
  Administered 2012-01-01: 60 mg via INTRAMUSCULAR
  Filled 2012-01-01 (×2): qty 1

## 2012-01-01 NOTE — ED Provider Notes (Signed)
History     CSN: [id number]  Arrival date & time 01/01/12  1612   First MD Initiated Contact with Patient 01/01/12 1829      Chief Complaint  Patient presents with  . Fall  . Headache  . Back Pain    (Consider location/radiation/quality/duration/timing/severity/associated sxs/prior treatment) HPI History from patient. 61 year old female who presents after a fall this morning. She states that she tripped and fell and hit the right side of her head on the leg of a table. She denies LOC. She has not had any dizziness, nausea, vomiting, change in vision since. She has had a slight headache which is localized to the area where she had her head, described as throbbing in nature. No treatment at home for this prior to arrival. No known area/alleviating factors. On 81 mg aspirin daily but no other blood thinners.  She additionally injured her back during the fall. Currently complaining of sharp pain to her mid back area, which seems to radiate around to her left side. Pain worsens with movement. States it has been difficult to walk due to the pain, but she has been able to do so. She denies any radiation into her legs. Denies groin numbness, bowel/bladder dysfunction, urinary retention.  Past Medical History  Diagnosis Date  . Depression   . Cataract   . Hepatitis B   . Diabetes mellitus     Past Surgical History  Procedure Date  . Neck surgery   . Abdominal hysterectomy   . Cholecystectomy   . Knee surgery   . Ankle surgery   . Cesarean section   . Appendectomy     not sure, may have been done with hysterectomy    No family history on file.  History  Substance Use Topics  . Smoking status: Not on file  . Smokeless tobacco: Not on file  . Alcohol Use: No    OB History    Grav Para Term Preterm Abortions TAB SAB Ect Mult Living                  Review of Systems  Constitutional: Negative for fever, chills, activity change and appetite change.  HENT: Negative for neck  pain, neck stiffness and tinnitus.   Eyes: Negative for photophobia and visual disturbance.  Respiratory: Negative for cough and shortness of breath.   Cardiovascular: Negative for chest pain.  Gastrointestinal: Negative for abdominal pain.  Musculoskeletal: Positive for myalgias, back pain and gait problem.  Skin: Negative for color change and rash.  Neurological: Positive for headaches. Negative for dizziness and weakness.    Allergies  Review of patient's allergies indicates no known allergies.  Home Medications   Current Outpatient Rx  Name Route Sig Dispense Refill  . ALPRAZOLAM 0.5 MG PO TABS Oral Take 0.5 mg by mouth 3 (three) times daily as needed. For anxiety.    Marland Kitchen VITAMIN C 1000 MG PO TABS Oral Take 1,000 mg by mouth daily.    . ASPIRIN EC 81 MG PO TBEC Oral Take 81 mg by mouth daily.    Marland Kitchen CALCIUM + D + K PO Oral Take 1 tablet by mouth daily.    Marland Kitchen VITAMIN D 2000 UNITS PO CAPS Oral Take 1 capsule by mouth daily.    Marland Kitchen CITALOPRAM HYDROBROMIDE 20 MG PO TABS Oral Take 20 mg by mouth daily.    . OMEGA-3 FATTY ACIDS 1000 MG PO CAPS Oral Take 1 g by mouth 2 (two) times daily.    Marland Kitchen  INSULIN GLARGINE 100 UNIT/ML [place] SOLN Subcutaneous Inject 60 Units into the skin daily.    . INSULIN ISOPHANE HUMAN 100 UNIT/ML [place] SUSP Subcutaneous Inject 10-30 Units into the skin 3 (three) times daily. Sliding scale insulin.    Marland Kitchen LOSARTAN POTASSIUM-HCTZ 100-25 MG PO TABS Oral Take 1 tablet by mouth daily.    Marland Kitchen METFORMIN HCL 1000 MG PO TABS Oral Take 1,000 mg by mouth 2 (two) times daily with a meal.    . MILK THISTLE PO Oral Take 1 capsule by mouth 2 (two) times daily.    Marland Kitchen OVER THE COUNTER MEDICATION Oral Take 1 tablet by mouth daily. Diabetes Health multivitamin pack.    Marland Kitchen PRAVASTATIN SODIUM 40 MG PO TABS Oral Take 40 mg by mouth daily.      BP 136/74  Pulse 101  Temp(Src) 97.7 F (36.5 C) (Oral)  Resp 18  SpO2 96%  Physical Exam  Nursing note and vitals reviewed. Constitutional: She is  oriented to person, place, and time. She appears well-developed and well-nourished. No distress.  HENT:  Head: Normocephalic and atraumatic.    Right Ear: External ear normal.  Left Ear: External ear normal.  Mouth/Throat: Oropharynx is clear and moist.       Mildly ttp as diagrammed, no hematomas, deformity, or crepitus noted  Eyes: EOM are normal.  Neck: Normal range of motion. Neck supple.  Cardiovascular: Normal rate, regular rhythm and normal heart sounds.   Pulmonary/Chest: Effort normal and breath sounds normal. She exhibits no tenderness.  Abdominal: Soft. Bowel sounds are normal. There is no tenderness.  Musculoskeletal: Normal range of motion.       Spine: No palpable stepoff, crepitus, or gross deformity appreciated. No appreciable spasm of paravertebral muscles. Midline and paravertebral muscle tenderness over upper lumbar spine.   Lymphadenopathy:    She has no cervical adenopathy.  Neurological: She is alert and oriented to person, place, and time. She has normal strength. No cranial nerve deficit or sensory deficit. GCS eye subscore is 4. GCS verbal subscore is 5. GCS motor subscore is 6.  Reflex Scores:      Achilles reflexes are 2+ on the right side and 2+ on the left side. Skin: Skin is warm and dry. She is not diaphoretic.  Psychiatric: She has a normal mood and affect.    ED Course  Procedures (including critical care time)  Labs Reviewed - No data to display Dg Ribs Unilateral W/chest Left  01/01/2012  *RADIOLOGY REPORT*  Clinical Data: 61 year old female with fall, pain, left rib pain.  LEFT RIBS AND CHEST - 3+ VIEW  Comparison: 03/07/2007.  Findings: Better lung volumes.  Cardiac size and mediastinal contours are within normal limits.  Visualized tracheal air column is within normal limits.  The lungs are clear.  No pneumothorax or effusion.  Cervical ACDF hardware and right upper quadrant surgical clips again noted.  Mild motion artifact on oblique rib views.   No acute displaced left rib fracture identified.  IMPRESSION:  1. No displaced left rib fracture identified. 2. No acute cardiopulmonary abnormality.  Original Report Authenticated By: Harley Hallmark, M.D.   Dg Cervical Spine Complete  01/01/2012  *RADIOLOGY REPORT*  Clinical Data: Post fall, history of cervical spine fusion  CERVICAL SPINE - COMPLETE 4+ VIEW  Comparison: None.  Findings:  C1 to the superior endplate of C7 is visualized on the lateral radiograph.  The cervical thoracic junction and is suboptimally evaluate on the provided swimmers radiograph.  Stable sequela  of C4 - C6 ACDF without evidence of hardware failure or loosening.  No definite anterolisthesis or retrolisthesis.  There is unchanged minimal leftward deviation evidence between the lateral masses of C1, likely positional.  Certebral body heights are preserved.  Prevertebral soft tissues are normal  Redemonstrated mild DDD of C6 - C7 with disc space height loss, end plate irregularity and osteophytosis.  Bilateral neural foramina appear patent.  Regional soft tissues are normal. Limited visualization of the lung apices is normal.  IMPRESSION: 1.  Stable sequela of C4 - C6 ACDF without evidence of hardware failure or loosening. 2.  Unchanged mild DDD at C6 - C7.  Original Report Authenticated By: Waynard Reeds, M.D.   Dg Lumbar Spine Complete  01/01/2012  *RADIOLOGY REPORT*  Clinical Data: 61 year old female with fall and pain.  LUMBAR SPINE - COMPLETE 4+ VIEW  Comparison: None.  Findings: Normal lumbar segmentation.  Vertebral height and alignment within normal limits.  Relatively preserved disc spaces. No pars fracture.  SI joints within normal limits.  Right upper quadrant surgical clips.  The sacrum appears intact.  Grossly intact visualized pelvis and lower thoracic levels.  IMPRESSION: No acute fracture or listhesis identified in the lumbar spine.  Original Report Authenticated By: Harley Hallmark, M.D.     1. Fall   2. Low  back pain       MDM  Patient presents after a fall early this morning which was mechanical in nature. Did strike the side of her head on the table in has pain to the same. She did not have any LOC with this and has a unremarkable neuro exam. I have a low suspicion for worrisome head injury, therefore CT of the head was deferred.   Also c/o low back pain. Her muscle strength, reflexes, and sensation is intact in the lower extremities bilaterally, which is reassuring. She was treated in the emergency department with Toradol, muscle relaxers, and Percocet and had relief of her back pain with this. Prescription given for home pain medication. Instruction to followup with PCP. Reasons to return to the emergency department discussed.      Grant Fontana, [place] 01/01/12 2214

## 2012-01-01 NOTE — ED Notes (Signed)
Pt reports tripping backwards over a pair of shoes this am. Larey Seat and hit R side of head on leg of a table and c/o lower back pain. Denies LOC, no lac/obvious trauma noted.

## 2012-01-01 NOTE — Discharge Instructions (Signed)
Your xrays appeared normal today. You likely have a strain of the muscles in your back. Apply ice to the areas that are painful. Use the pain medication as needed. Return to the ED with worsening pain, confusion, worsening headache, nausea/vomiting, dizziness, visual change, or any other worrisome symptoms.  Back Pain, Adult Low back pain is very common. About 1 in 5 people have back pain.The cause of low back pain is rarely dangerous. The pain often gets better over time.About half of people with a sudden onset of back pain feel better in just 2 weeks. About 8 in 10 people feel better by 6 weeks.  CAUSES Some common causes of back pain include:  Strain of the muscles or ligaments supporting the spine.   Wear and tear (degeneration) of the spinal discs.   Arthritis.   Direct injury to the back.  DIAGNOSIS Most of the time, the direct cause of low back pain is not known.However, back pain can be treated effectively even when the exact cause of the pain is unknown.Answering your caregiver's questions about your overall health and symptoms is one of the most accurate ways to make sure the cause of your pain is not dangerous. If your caregiver needs more information, he or she may order lab work or imaging tests (X-rays or MRIs).However, even if imaging tests show changes in your back, this usually does not require surgery. HOME CARE INSTRUCTIONS For many people, back pain returns.Since low back pain is rarely dangerous, it is often a condition that people can learn to [facility] their own.   Remain active. It is stressful on the back to sit or stand in one place. Do not sit, drive, or stand in one place for more than 30 minutes at a time. Take short walks on level surfaces as soon as pain allows.Try to increase the length of time you walk each day.   Do not stay in bed.Resting more than 1 or 2 days can delay your recovery.   Do not avoid exercise or work.Your body is made to move.It is  not dangerous to be active, even though your back may hurt.Your back will likely heal faster if you return to being active before your pain is gone.   Pay attention to your body when you bend and lift. Many people have less discomfortwhen lifting if they bend their knees, keep the load close to their bodies,and avoid twisting. Often, the most comfortable positions are those that put less stress on your recovering back.   Find a comfortable position to sleep. Use a firm mattress and lie on your side with your knees slightly bent. If you lie on your back, put a pillow under your knees.   Only take over-the-counter or prescription medicines as directed by your caregiver. Over-the-counter medicines to reduce pain and inflammation are often the most helpful.Your caregiver may prescribe muscle relaxant drugs.These medicines help dull your pain so you can more quickly return to your normal activities and healthy exercise.   Put ice on the injured area.   Put ice in a plastic bag.   Place a towel between your skin and the bag.   Leave the ice on for 15 to 20 minutes, 3 to 4 times a day for the first 2 to 3 days. After that, ice and heat may be alternated to reduce pain and spasms.   Ask your caregiver about trying back exercises and gentle massage. This may be of some benefit.   Avoid feeling anxious or  stressed.Stress increases muscle tension and can worsen back pain.It is important to recognize when you are anxious or stressed and learn ways to manage it.Exercise is a great option.  SEEK MEDICAL CARE IF:  You have pain that is not relieved with rest or medicine.   You have pain that does not improve in 1 week.   You have new symptoms.   You are generally not feeling well.  SEEK IMMEDIATE MEDICAL CARE IF:   You have pain that radiates from your back into your legs.   You develop new bowel or bladder control problems.   You have unusual weakness or numbness in your arms or legs.     You develop nausea or vomiting.   You develop abdominal pain.   You feel faint.  Document Released: 07/23/2005 Document Revised: 07/12/2011 Document Reviewed: 12/11/2010 [facility] Patient Information 2012 [place], [place].

## 2012-01-02 NOTE — ED Provider Notes (Signed)
Medical screening examination/treatment/procedure(s) were performed by non-physician practitioner and as supervising physician I was immediately available for consultation/collaboration.   Celene Kras, MD 01/02/12 [phone]

## 2012-03-04 ENCOUNTER — Encounter: Payer: BC Managed Care – PPO | Attending: Family Medicine | Admitting: *Deleted

## 2012-03-04 DIAGNOSIS — Z713 Dietary counseling and surveillance: Secondary | ICD-10-CM | POA: Insufficient documentation

## 2012-03-04 DIAGNOSIS — E119 Type 2 diabetes mellitus without complications: Secondary | ICD-10-CM | POA: Insufficient documentation

## 2012-03-13 ENCOUNTER — Encounter: Payer: Self-pay | Admitting: *Deleted

## 2012-03-13 NOTE — Progress Notes (Signed)
  Patient was seen on 03/04/2012 for the first of a series of three diabetes self-management courses at [facility].  Current A1c = 11.0% on 01/2012 The following learning objectives were met by the patient during this course:   Defines the role of glucose and insulin  Identifies type of diabetes and pathophysiology  Defines the diagnostic criteria for diabetes and prediabetes  States the risk factors for Type 2 Diabetes  States the symptoms of Type 2 Diabetes  Defines Type 2 Diabetes treatment goals  Defines Type 2 Diabetes treatment options  States the rationale for glucose monitoring  Identifies A1C, glucose targets, and testing times  Identifies proper sharps disposal  Defines the purpose of a diabetes food plan  Identifies carbohydrate food groups  Defines effects of carbohydrate foods on glucose levels  Identifies carbohydrate choices/grams/food labels  States benefits of physical activity and effect on glucose  Review of suggested activity guidelines  Handouts given during class include:  Type 2 Diabetes: Basics Book  My Food Plan Book  Food and Activity Log  Follow-Up Plan: Core Class 2

## 2012-03-13 NOTE — Patient Instructions (Signed)
Goals:  Follow Diabetes Meal Plan as instructed  Eat 3 meals and 2 snacks, every 3-5 hrs  Limit carbohydrate intake to 30-45 grams carbohydrate/meal  Limit carbohydrate intake to 0-15 grams carbohydrate/snack  Add lean protein foods to meals/snacks  Monitor glucose levels as instructed by your doctor  Aim for 15-30 mins of physical activity daily  Bring food record and glucose log to your next nutrition visit   

## 2012-03-26 ENCOUNTER — Ambulatory Visit: Payer: BC Managed Care – PPO

## 2012-04-08 ENCOUNTER — Encounter: Payer: BC Managed Care – PPO | Attending: Family Medicine | Admitting: *Deleted

## 2012-04-08 DIAGNOSIS — E119 Type 2 diabetes mellitus without complications: Secondary | ICD-10-CM | POA: Insufficient documentation

## 2012-04-08 DIAGNOSIS — Z713 Dietary counseling and surveillance: Secondary | ICD-10-CM | POA: Insufficient documentation

## 2012-04-09 ENCOUNTER — Encounter: Payer: Self-pay | Admitting: *Deleted

## 2012-04-09 ENCOUNTER — Encounter: Payer: BC Managed Care – PPO | Admitting: Dietician

## 2012-04-09 DIAGNOSIS — E119 Type 2 diabetes mellitus without complications: Secondary | ICD-10-CM

## 2012-04-09 NOTE — Progress Notes (Signed)
  Patient was seen on 04/08/12 for the second of a series of three diabetes self-management courses at the [facility]. The following learning objectives were met by the patient during this course:   Explain basic nutrition maintenance and quality assurance  Describe causes, symptoms and treatment of hypoglycemia and hyperglycemia  Explain how to manage diabetes during illness  Describe the importance of good nutrition for health and healthy eating strategies  List strategies to follow meal plan when dining out  Describe the effects of alcohol on glucose and how to use it safely  Describe problem solving skills for day-to-day glucose challenges  Describe strategies to use when treatment plan needs to change  Identify important factors involved in successful weight loss  Describe ways to remain physically active  Describe the impact of regular activity on insulin resistance   Handouts given in class:  Refrigerator magnet for Sick Day Guidelines  [facility] Oral medication/insulin handout  Follow-Up Plan: Patient will attend the final class of the ADA Diabetes Self-Care Education.   

## 2012-04-09 NOTE — Patient Instructions (Signed)
Goals:  Follow Diabetes Meal Plan as instructed  Eat 3 meals and 2 snacks, every 3-5 hrs  Limit carbohydrate intake to 30-45 grams carbohydrate/meal  Limit carbohydrate intake to 15 grams carbohydrate/snack  Add lean protein foods to meals/snacks  Monitor glucose levels as instructed by your doctor  Aim for 30 mins of physical activity daily  Bring food record and glucose log to your next nutrition visit 

## 2012-04-10 NOTE — Progress Notes (Signed)
  Patient was seen on 04/09/2012 for the third of a series of three diabetes self-management courses at the [facility]. The following learning objectives were met by the patient during this course:    Describe how diabetes changes over time   Identify diabetes complications and ways to prevent them   Describe strategies that can promote heart health including lowering blood pressure and cholesterol   Describe strategies to lower dietary fat and sodium in the diet   Identify physical activities that benefit cardiovascular health   Evaluate success in meeting personal goal   Describe the belief that they can live successfully with diabetes day to day   Establish 2-3 goals that they will plan to diligently work on until they return for the free 37-month follow-up visit  The following handouts were given in class:  3 Month Follow Up Visit handout  Goal setting handout  Class evaluation form  Your patient has established the following 3 month goals for diabetes self-care:  I will increase my physical activity with walking.  Try to take my medicine at the same time each day.  Read labels better  Follow-Up Plan: Patient will attend a 3 month follow-up visit for diabetes self-management education.

## 2012-04-24 ENCOUNTER — Encounter: Payer: Self-pay | Admitting: Gastroenterology

## 2012-07-16 ENCOUNTER — Ambulatory Visit: Payer: BC Managed Care – PPO | Admitting: Dietician

## 2013-06-23 ENCOUNTER — Encounter: Payer: Self-pay | Admitting: Gastroenterology

## 2013-06-23 ENCOUNTER — Other Ambulatory Visit (HOSPITAL_COMMUNITY): Payer: Self-pay | Admitting: Family Medicine

## 2013-06-23 DIAGNOSIS — Z1231 Encounter for screening mammogram for malignant neoplasm of breast: Secondary | ICD-10-CM

## 2013-07-14 ENCOUNTER — Ambulatory Visit (HOSPITAL_COMMUNITY): Payer: BC Managed Care – PPO | Attending: Family Medicine

## 2013-07-21 ENCOUNTER — Encounter: Payer: Self-pay | Admitting: Gastroenterology

## 2013-07-21 ENCOUNTER — Ambulatory Visit (INDEPENDENT_AMBULATORY_CARE_PROVIDER_SITE_OTHER): Payer: BC Managed Care – PPO | Admitting: Gastroenterology

## 2013-07-21 ENCOUNTER — Other Ambulatory Visit: Payer: Self-pay | Admitting: Family Medicine

## 2013-07-21 VITALS — BP 116/68 | HR 72 | Ht 63.0 in | Wt 220.0 lb

## 2013-07-21 DIAGNOSIS — Z86010 Personal history of colonic polyps: Secondary | ICD-10-CM

## 2013-07-21 DIAGNOSIS — R197 Diarrhea, unspecified: Secondary | ICD-10-CM

## 2013-07-21 DIAGNOSIS — R42 Dizziness and giddiness: Secondary | ICD-10-CM

## 2013-07-21 MED ORDER — MOVIPREP 100 G PO SOLR: 1.0000 | Freq: Once | ORAL | Status: DC

## 2013-07-21 MED ORDER — CHOLESTYRAMINE 4 GM/DOSE PO POWD: 4.0000 g | Freq: Every day | ORAL | Status: DC

## 2013-07-21 NOTE — Progress Notes (Signed)
Review of pertinent gastrointestinal problems: 1. Adenomatous polyps: 12/2007 colonoscopy Christella Hartigan done for diarrhea; found two polyps (one was >1cm), recommended recall at 3 years. Reminder letters were sent to her in 2012 and in 2013. 2. Chronic diarrhea, colonoscopy above, random colon biopsies were normal.  Symptoms seemed to have started after GB removal.  In 2009 I recommended cholestyramine trial and it helped however when she ran out of her prescription she never called back to refill it.    HPI: This is a  very pleasant 62 year old woman who is here with a daughter today. I have not seen her in many years.  She is still bothered by intermittent diarrhea.  Can have real urgency.  Can even have incontinence.  Has been going on for many years.  NEver sees blood.  Very loose.    Cholecystyramin really helped.  She took this for 2 or 3 months and then when the prescription ran out she never called for refill.  She believes her last hemoglobin A1c was 10   Review of systems: Pertinent positive and negative review of systems were noted in the above HPI section. Complete review of systems was performed and was otherwise normal.    Past Medical History  Diagnosis Date  . Depression   . Cataract   . Hepatitis B   . Diabetes mellitus   . Obesity     Past Surgical History  Procedure Laterality Date  . Neck surgery    . Abdominal hysterectomy    . Cholecystectomy    . Knee surgery    . Ankle surgery    . Cesarean section    . Appendectomy      not sure, may have been done with hysterectomy    Current Outpatient Prescriptions  Medication Sig Dispense Refill  . ALPRAZolam (XANAX) 0.5 MG tablet Take 0.5 mg by mouth 3 (three) times daily as needed. For anxiety.      . Ascorbic Acid (VITAMIN C) 1000 MG tablet Take 1,000 mg by mouth daily.      Marland Kitchen aspirin EC 81 MG tablet Take 81 mg by mouth daily.      . Calcium-Vitamin D-Vitamin K (CALCIUM + D + K PO) Take 1 tablet by mouth daily.       . Cholecalciferol (VITAMIN D) 2000 UNITS CAPS Take 1 capsule by mouth daily.      . citalopram (CELEXA) 20 MG tablet Take 20 mg by mouth daily.      . Dapagliflozin Propanediol (FARXIGA) 10 MG TABS Take 1 capsule by mouth daily.      . fish oil-omega-3 fatty acids 1000 MG capsule Take 1 g by mouth 2 (two) times daily.      . insulin glargine (LANTUS) 100 UNIT/ML injection Inject 60 Units into the skin daily.      . insulin glulisine (APIDRA) 100 UNIT/ML injection Inject into the skin 3 (three) times daily before meals. Sliding scale      . losartan-hydrochlorothiazide (HYZAAR) 100-25 MG per tablet Take 1 tablet by mouth daily.      Marland Kitchen MILK THISTLE PO Take 1 capsule by mouth 2 (two) times daily.      Marland Kitchen OVER THE COUNTER MEDICATION Take 1 tablet by mouth daily. Diabetes Health multivitamin pack.      . pravastatin (PRAVACHOL) 40 MG tablet Take 40 mg by mouth daily.       No current facility-administered medications for this visit.    Allergies as of 07/21/2013  . (No  Known Allergies)    Family History  Problem Relation Age of Onset  . Cancer Father     History   Social History  . Marital Status: Married    Spouse Name: N/A    Number of Children: N/A  . Years of Education: N/A   Occupational History  . Not on file.   Social History Main Topics  . Smoking status: Never Smoker   . Smokeless tobacco: Not on file  . Alcohol Use: No  . Drug Use: No  . Sexual Activity: Not on file   Other Topics Concern  . Not on file   Social History Narrative  . No narrative on file       Physical Exam: BP 116/68  Pulse 72  Ht 5\' 3"  (1.6 m)  Wt 220 lb (99.791 kg)  BMI 38.98 kg/m2 Constitutional: generally well-appearing, except for obesity  Psychiatric: alert and oriented x3 Eyes: extraocular movements intact Mouth: oral pharynx moist, no lesions Neck: supple no lymphadenopathy Cardiovascular: heart regular rate and rhythm Lungs: clear to auscultation bilaterally Abdomen:  soft, nontender, nondistended, no obvious ascites, no peritoneal signs, normal bowel sounds Extremities: no lower extremity edema bilaterally Skin: no lesions on visible extremities    Assessment and plan: 62 y.o. female with  chronic diarrhea likely due to gallbladder removal many years ago. Personal history of precancerous colon polyps  We have reminded her by letter twice about the need for surveillance colonoscopy and I reminded her again today. July, and schedule a colonoscopy at this point. Her chronic diarrhea was investigated previously, random colon biopsies were normal in 2009 and a cholestyramine trial helped significantly. I'm going to re\re prescribe that medicine for her and she knows to call here if that doesn't work or it. She should also call if it does work and she runs out of her prescription. I see no reason for any further blood tests or imaging studies at this point.

## 2013-07-21 NOTE — Patient Instructions (Signed)
Restart the cholestyramine powder, one dose once daily. You will be set up for a colonoscopy for polyp surveillance ([facility], moderate sedation).

## 2013-07-25 ENCOUNTER — Ambulatory Visit
Admission: RE | Admit: 2013-07-25 | Discharge: 2013-07-25 | Disposition: A | Discharge status: Home / Self Care | Payer: BC Managed Care – PPO | Source: Ambulatory Visit | Attending: Family Medicine | Admitting: Family Medicine

## 2013-07-25 DIAGNOSIS — R42 Dizziness and giddiness: Secondary | ICD-10-CM

## 2013-08-04 ENCOUNTER — Encounter: Payer: Self-pay | Admitting: Gastroenterology

## 2013-08-04 ENCOUNTER — Ambulatory Visit (AMBULATORY_SURGERY_CENTER): Payer: BC Managed Care – PPO | Admitting: Gastroenterology

## 2013-08-04 VITALS — BP 161/58 | HR 74 | Temp 96.5°F | Resp 17 | Ht 63.0 in | Wt 220.0 lb

## 2013-08-04 DIAGNOSIS — Z86010 Personal history of colonic polyps: Secondary | ICD-10-CM

## 2013-08-04 DIAGNOSIS — D126 Benign neoplasm of colon, unspecified: Secondary | ICD-10-CM

## 2013-08-04 DIAGNOSIS — R197 Diarrhea, unspecified: Secondary | ICD-10-CM

## 2013-08-04 LAB — GLUCOSE, CAPILLARY: Glucose-Capillary: 124 mg/dL — ABNORMAL HIGH (ref 70–99)

## 2013-08-04 MED ORDER — SODIUM CHLORIDE 0.9 % IV SOLN: 500.0000 mL | INTRAVENOUS | Status: DC

## 2013-08-04 NOTE — Progress Notes (Signed)
Patient did not experience any of the following events: a burn prior to discharge; a fall within the facility; wrong site/side/patient/procedure/implant event; or a hospital transfer or hospital admission upon discharge from the facility. (G8907) Patient did not have preoperative order for IV antibiotic SSI prophylaxis. (G8918)  

## 2013-08-04 NOTE — Patient Instructions (Signed)
Discharge instructions given with verbal understanding. Handout on polyps. \resume previous medications. YOU HAD AN ENDOSCOPIC PROCEDURE TODAY AT THE [facility]: Refer to the procedure report that was given to you for any specific questions about what was found during the examination.  If the procedure report does not answer your questions, please call your gastroenterologist to clarify.  If you requested that your care partner not be given the details of your procedure findings, then the procedure report has been included in a sealed envelope for you to review at your convenience later.  YOU SHOULD EXPECT: Some feelings of bloating in the abdomen. Passage of more gas than usual.  Walking can help get rid of the air that was put into your GI tract during the procedure and reduce the bloating. If you had a lower endoscopy (such as a colonoscopy or flexible sigmoidoscopy) you may notice spotting of blood in your stool or on the toilet paper. If you underwent a bowel prep for your procedure, then you may not have a normal bowel movement for a few days.  DIET: Your first meal following the procedure should be a light meal and then it is ok to progress to your normal diet.  A half-sandwich or bowl of soup is an example of a good first meal.  Heavy or fried foods are harder to digest and may make you feel nauseous or bloated.  Likewise meals heavy in dairy and vegetables can cause extra gas to form and this can also increase the bloating.  Drink plenty of fluids but you should avoid alcoholic beverages for 24 hours.  ACTIVITY: Your care partner should take you home directly after the procedure.  You should plan to take it easy, moving slowly for the rest of the day.  You can resume normal activity the day after the procedure however you should NOT DRIVE or use heavy machinery for 24 hours (because of the sedation medicines used during the test).    SYMPTOMS TO REPORT IMMEDIATELY: A  gastroenterologist can be reached at any hour.  During normal business hours, 8:30 AM to 5:00 PM Monday through Friday, call [phone].  After hours and on weekends, please call the GI answering service at [phone] who will take a message and have the physician on call contact you.   Following lower endoscopy (colonoscopy or flexible sigmoidoscopy):  Excessive amounts of blood in the stool  Significant tenderness or worsening of abdominal pains  Swelling of the abdomen that is new, acute  Fever of 100F or higher FOLLOW UP: If any biopsies were taken you will be contacted by phone or by letter within the next 1-3 weeks.  Call your gastroenterologist if you have not heard about the biopsies in 3 weeks.  Our staff will call the home number listed on your records the next business day following your procedure to check on you and address any questions or concerns that you may have at that time regarding the information given to you following your procedure. This is a courtesy call and so if there is no answer at the home number and we have not heard from you through the emergency physician on call, we will assume that you have returned to your regular daily activities without incident.  SIGNATURES/CONFIDENTIALITY: You and/or your care partner have signed paperwork which will be entered into your electronic medical record.  These signatures attest to the fact that that the information above on your After Visit Summary has been reviewed  and is understood.  Full responsibility of the confidentiality of this discharge information lies with you and/or your care-partner. 

## 2013-08-04 NOTE — Op Note (Signed)
[facility] [address] [place], [address]   COLONOSCOPY PROCEDURE REPORT  PATIENT: Ellen, Mccoy  MR#: [id number] BIRTHDATE: 1950/09/10 , 62  yrs. old GENDER: Female ENDOSCOPIST: Rachael Fee, MD PROCEDURE DATE:  08/04/2013 PROCEDURE:   Colonoscopy with snare polypectomy First Screening Colonoscopy - Avg.  risk and is 50 yrs.  old or older - No.  Prior Negative Screening - Now for repeat screening. N/A  History of Adenoma - Now for follow-up colonoscopy & has been > or = to 3 yrs.  Yes hx of adenoma.  Has been 3 or more years since last colonoscopy.  Polyps Removed Today? Yes. ASA CLASS:   Class II INDICATIONS:Adenomatous polyps: 12/2007 colonoscopy Christella Hartigan done for diarrhea; found two polyps (one was >1cm), recommended recall at 3 years. MEDICATIONS: Fentanyl 50 mcg IV, Versed 5 mg IV, and These medications were titrated to patient response per physician's verbal order  DESCRIPTION OF PROCEDURE:   After the risks benefits and alternatives of the procedure were thoroughly explained, informed consent was obtained.  A digital rectal exam revealed no abnormalities of the rectum.   The LB [id number]  endoscope was introduced through the anus and advanced to the cecum, which was identified by both the appendix and ileocecal valve. No adverse events experienced.   The quality of the prep was good.  The instrument was then slowly withdrawn as the colon was fully examined.  COLON FINDINGS: Two polyps were found, removed and both were sent to pathology.  One was heaped up, 12mm across, located in ascending segment, removed with snare/cautery (path jar 1).  The other was sessile, 3mm across, located in sigmoid, removed with cold snare, path jar 2.  The examination was otherwise normal.  Retroflexed views revealed no abnormalities. The time to cecum=3 minutes 01 seconds.  Withdrawal time=17 minutes 30 seconds.  The scope was withdrawn and the procedure  completed. COMPLICATIONS: There were no complications.  ENDOSCOPIC IMPRESSION: Two polyps were found, removed and both were sent to pathology. The examination was otherwise normal.  RECOMMENDATIONS: If the polyp(s) removed today are proven to be adenomatous (pre-cancerous) polyps, you will need a colonoscopy in 3 years. You will receive a letter within 1-2 weeks with the results of your biopsy as well as final recommendations.  Please call my office if you have not received a letter after 3 weeks. Continue daily cholestyramine for your chronic loose stools.   eSigned:  Rachael Fee, MD 08/04/2013 9:35 AM  cc: Warrick Parisian, MD

## 2013-08-05 ENCOUNTER — Telehealth: Payer: Self-pay | Admitting: *Deleted

## 2013-08-05 NOTE — Telephone Encounter (Signed)
  Follow up Call-  Call back number 08/04/2013  Post procedure Call Back phone  # [phone]  Permission to leave phone message Yes     Patient questions:  Do you have a fever, pain , or abdominal swelling? no Pain Score  0 *  Have you tolerated food without any problems? yes  Have you been able to return to your normal activities? yes  Do you have any questions about your discharge instructions: Diet   no Medications  no Follow up visit  no  Do you have questions or concerns about your Care? no  Actions: * If pain score is 4 or above: No action needed, pain <4.

## 2013-08-13 ENCOUNTER — Encounter: Payer: Self-pay | Admitting: Gastroenterology

## 2014-04-27 ENCOUNTER — Telehealth: Payer: Self-pay | Admitting: Gastroenterology

## 2014-04-28 NOTE — Telephone Encounter (Signed)
The pt is having fecal incontinence and her pills are coming out undigested.  She has 1-2 stools daily.  No pain just a sudden urge and watery, explosive diarrhea.  Appt made with [name] for tomorrow 04/29/14 9 am

## 2014-04-29 ENCOUNTER — Ambulatory Visit (INDEPENDENT_AMBULATORY_CARE_PROVIDER_SITE_OTHER): Payer: BC Managed Care – PPO | Admitting: Nurse Practitioner

## 2014-04-29 ENCOUNTER — Encounter: Payer: Self-pay | Admitting: Nurse Practitioner

## 2014-04-29 VITALS — BP 148/82 | HR 92 | Ht 63.0 in | Wt 241.0 lb

## 2014-04-29 DIAGNOSIS — R197 Diarrhea, unspecified: Secondary | ICD-10-CM

## 2014-04-29 DIAGNOSIS — R1319 Other dysphagia: Secondary | ICD-10-CM

## 2014-04-29 DIAGNOSIS — K529 Noninfective gastroenteritis and colitis, unspecified: Secondary | ICD-10-CM

## 2014-04-29 MED ORDER — METRONIDAZOLE 250 MG PO TABS: 250.0000 mg | ORAL_TABLET | Freq: Three times a day (TID) | ORAL | Status: DC

## 2014-04-29 NOTE — Patient Instructions (Addendum)
You have been scheduled for an endoscopy with Dr. Ardis Hughs. Please follow written instructions given to you at your visit today. If you use inhalers (even only as needed), please bring them with you on the day of your procedure. Your physician has requested that you go to www.startemmi.com and enter the access code given to you at your visit today. This web site gives a general overview about your procedure. However, you should still follow specific instructions given to you by our office regarding your preparation for the procedure.  We have sent the following medications to your pharmacy for you to pick up at your convenience: Flagyl 250 mg, please take one tablet by mouth three times daily for ten days    We discussed the importance of eating slowly, taking small bites of food, chewing well and consuming adequate amounts of fluid in between bites to avoid food impaction.   If diarrhea does not improve after completion of antibiotics, call our office. We will then try Lomotil 3 times daily. He will still be able to use Imodium as needed (up to 16 mg daily)

## 2014-04-29 NOTE — Progress Notes (Signed)
     History of Present Illness:   Patient is a 63 year old female known to Dr. Ardis Hughs for history of adenomatous colon polyps and chronic diarrhea. Diarrhea began following cholecystectomy in 2006. She was evaluated by Dr. Ardis Hughs in 2009 at which time a colonoscopy was done. Adenomatous polyps were removed, random biopsies of the colon were obtained and negative. Patient was last seen December 2014 (again for chronic diarrhea associated with urgency / incontinence).   She subsequently underwent surveillance colonoscopy for history of polyps. Two adenomatous polyps were removed, exam otherwise normal. We felt diarrhea was probably bile acid related, trial of cholestyramine given. Patient is back today with complaints of diarrhea / incontinence. Cholestyramine didn't help, she has just been using Imodium. No recent medication changes or antibiotics use. Daughter worried about blood sugars as Metformin pills being passed in stool intact.  Patient also complains of solid food dysphagia over last several months. No dysphagia. She was tried on Nexium but it didn't help. No history of GERD. No unexplained weight loss.   Current Medications, Allergies, Past Medical History, Past Surgical History, Family History and Social History were reviewed in [employer] record.  Physical Exam: General: Pleasant, obese, white female in no acute distress Head: Normocephalic and atraumatic Eyes:  sclerae anicteric, conjunctiva pink  Ears: Normal auditory acuity Lungs: Clear throughout to auscultation Heart: Regular rate and rhythm Abdomen: Soft, non distended, non-tender. No masses, no hepatomegaly. Normal bowel sounds Musculoskeletal: Symmetrical with no gross deformities  Extremities: No edema  Neurological: Alert oriented x 4, grossly nonfocal Psychological:  Alert and cooperative. Normal mood and affect  Assessment and Recommendations:  32, 63 year old female with chronic diarrhea /  incontinence. Taking Imodium. Cholestyramine didn't help. Daughter asks about Welchol, I explained it is a bile acid binder like cholestyramine. Patient is miserable. Will try course of flagyl as she could have small intestine bacterial overgrowth secondary to intestinal enteropathy. If his doesn't work will try adding lomotil. We discussed metformin which she has taken for years. Since metformin is known to cause diarrhea I think it is worthwhile to hold it for a few weeks to see if it makes a difference. Patient to talk to her endocrinologist about this. In the meantime patient will let us know how she responds to Flagyl.   2. Solid food dysphagia, ongoing for several months. For further evaluation patient will be scheduled for EGD with possible dilation. The benefits, risks, and potential complications of EGD with possible biopsies and/or dilation were discussed with the patient and she agrees to proceed. We can evaluate for celiac disease at time of EGD (see #1)

## 2014-04-30 DIAGNOSIS — K529 Noninfective gastroenteritis and colitis, unspecified: Secondary | ICD-10-CM | POA: Insufficient documentation

## 2014-04-30 DIAGNOSIS — R1319 Other dysphagia: Secondary | ICD-10-CM | POA: Insufficient documentation

## 2014-05-01 NOTE — Progress Notes (Signed)
i agree with the above note, plan 

## 2014-05-04 ENCOUNTER — Telehealth: Payer: Self-pay | Admitting: Gastroenterology

## 2014-05-04 NOTE — Telephone Encounter (Signed)
Patient asked what to do about her insulin.  I told her to take half of her insulin tonite and hold it tomorrow am.  I told her that we would rather have her be a little high than very low.  She agreed.  I also told her that she could use her eye drops as ordered.

## 2014-05-04 NOTE — Telephone Encounter (Signed)
Returned patient's call and no answer.  Left message for patient to call me at [phone].

## 2014-05-05 ENCOUNTER — Ambulatory Visit (AMBULATORY_SURGERY_CENTER): Payer: BC Managed Care – PPO | Admitting: Gastroenterology

## 2014-05-05 ENCOUNTER — Encounter: Payer: Self-pay | Admitting: Gastroenterology

## 2014-05-05 VITALS — BP 142/74 | HR 75 | Temp 97.6°F | Resp 16 | Ht 63.0 in | Wt 241.0 lb

## 2014-05-05 DIAGNOSIS — R1319 Other dysphagia: Secondary | ICD-10-CM

## 2014-05-05 LAB — GLUCOSE, CAPILLARY
Glucose-Capillary: 206 mg/dL — ABNORMAL HIGH (ref 70–99)
Glucose-Capillary: 212 mg/dL — ABNORMAL HIGH (ref 70–99)

## 2014-05-05 MED ORDER — SODIUM CHLORIDE 0.9 % IV SOLN: 500.0000 mL | INTRAVENOUS | Status: DC

## 2014-05-05 NOTE — Progress Notes (Signed)
Report to PACU, RN, vss, BBS= Clear.  

## 2014-05-05 NOTE — Op Note (Signed)
[place]  [address]. [place], [address]   ENDOSCOPY PROCEDURE REPORT  PATIENT: Ellen, Mccoy  MR#: [id number] BIRTHDATE: 1950-08-27 , 42  yrs. old GENDER: female ENDOSCOPIST: Milus Banister, MD PROCEDURE DATE:  05/05/2014 PROCEDURE:  EGD, diagnostic ASA CLASS:     Class III INDICATIONS:  chronic intermittent dysphagia without weight loss. MEDICATIONS: Monitored anesthesia care and Propofol 150 mg IV TOPICAL ANESTHETIC: none  DESCRIPTION OF PROCEDURE: After the risks benefits and alternatives of the procedure were thoroughly explained, informed consent was obtained.  The LB [id number] endoscope was introduced through the mouth and advanced to the second portion of the duodenum , Without limitations.  The instrument was slowly withdrawn as the mucosa was fully examined.  ESOPHAGUS: The esophagus and gastroesophageal junction were completely normal in appearance.  The stomach was entered and closely examined.The antrum, angularis, and lesser curvature were well visualized, including a retroflexed view of the cardia and fundus.  The stomach wall was normally distensable.  The scope passed easily through the pylorus into the duodenum.  Retroflexed views revealed no abnormalities.     The scope was then withdrawn from the patient and the procedure completed.  COMPLICATIONS: There were no immediate complications.  ENDOSCOPIC IMPRESSION: Normal examination  RECOMMENDATIONS: Chew your food well, eat slowly and take small bites. Please restart metformin for 1-2 weeks and call Dr.  Ardis Hughs' office to report on your response (does the diarrhea come back?)   eSigned:  Milus Banister, MD 05/05/2014 11:02 AM   cc Leonia Reader, MD     The ICD and CPT codes recommended by this software are interpretations from the data that the clinical staff has captured with the software.  The verification of the translation of this report to the ICD and CPT  codes and modifiers is the sole responsibility of the health care institution and practicing physician where this report was generated.  [employer] will not be held responsible for the validity of the ICD and CPT codes included on this report.  AMA assumes no liability for data contained or not contained herein. CPT is a Designer, television/film set of the [employer].

## 2014-05-05 NOTE — Patient Instructions (Signed)
Discharge instructions given with verbal understanding. Normal exam. Resume previous medications. YOU HAD AN ENDOSCOPIC PROCEDURE TODAY AT THE [facility]: Refer to the procedure report that was given to you for any specific questions about what was found during the examination.  If the procedure report does not answer your questions, please call your gastroenterologist to clarify.  If you requested that your care partner not be given the details of your procedure findings, then the procedure report has been included in a sealed envelope for you to review at your convenience later.  YOU SHOULD EXPECT: Some feelings of bloating in the abdomen. Passage of more gas than usual.  Walking can help get rid of the air that was put into your GI tract during the procedure and reduce the bloating. If you had a lower endoscopy (such as a colonoscopy or flexible sigmoidoscopy) you may notice spotting of blood in your stool or on the toilet paper. If you underwent a bowel prep for your procedure, then you may not have a normal bowel movement for a few days.  DIET: Your first meal following the procedure should be a light meal and then it is ok to progress to your normal diet.  A half-sandwich or bowl of soup is an example of a good first meal.  Heavy or fried foods are harder to digest and may make you feel nauseous or bloated.  Likewise meals heavy in dairy and vegetables can cause extra gas to form and this can also increase the bloating.  Drink plenty of fluids but you should avoid alcoholic beverages for 24 hours.  ACTIVITY: Your care partner should take you home directly after the procedure.  You should plan to take it easy, moving slowly for the rest of the day.  You can resume normal activity the day after the procedure however you should NOT DRIVE or use heavy machinery for 24 hours (because of the sedation medicines used during the test).    SYMPTOMS TO REPORT IMMEDIATELY: A gastroenterologist  can be reached at any hour.  During normal business hours, 8:30 AM to 5:00 PM Monday through Friday, call [phone].  After hours and on weekends, please call the GI answering service at [phone] who will take a message and have the physician on call contact you.   Following upper endoscopy (EGD)  Vomiting of blood or coffee ground material  New chest pain or pain under the shoulder blades  Painful or persistently difficult swallowing  New shortness of breath  Fever of 100F or higher  Black, tarry-looking stools  FOLLOW UP: If any biopsies were taken you will be contacted by phone or by letter within the next 1-3 weeks.  Call your gastroenterologist if you have not heard about the biopsies in 3 weeks.  Our staff will call the home number listed on your records the next business day following your procedure to check on you and address any questions or concerns that you may have at that time regarding the information given to you following your procedure. This is a courtesy call and so if there is no answer at the home number and we have not heard from you through the emergency physician on call, we will assume that you have returned to your regular daily activities without incident.  SIGNATURES/CONFIDENTIALITY: You and/or your care partner have signed paperwork which will be entered into your electronic medical record.  These signatures attest to the fact that that the information above on your After   Visit Summary has been reviewed and is understood.  Full responsibility of the confidentiality of this discharge information lies with you and/or your care-partner. 

## 2014-05-06 ENCOUNTER — Telehealth: Payer: Self-pay | Admitting: *Deleted

## 2014-05-06 NOTE — Telephone Encounter (Signed)
  Follow up Call-  Call back number 05/05/2014 08/04/2013  Post procedure Call Back phone  # [phone]  Permission to leave phone message Yes Yes     Patient questions:  Do you have a fever, pain , or abdominal swelling? No. Pain Score  0 *  Have you tolerated food without any problems? Yes.    Have you been able to return to your normal activities? Yes.    Do you have any questions about your discharge instructions: Diet   No. Medications  No. Follow up visit  No.  Do you have questions or concerns about your Care? No.  Actions: * If pain score is 4 or above: No action needed, pain <4.

## 2014-05-24 ENCOUNTER — Emergency Department (HOSPITAL_COMMUNITY): Payer: BC Managed Care – PPO

## 2014-05-24 ENCOUNTER — Emergency Department (HOSPITAL_COMMUNITY)
Admission: EM | Admit: 2014-05-24 | Discharge: 2014-05-24 | Disposition: A | Discharge status: Home / Self Care | Payer: BC Managed Care – PPO | Attending: Emergency Medicine | Admitting: Emergency Medicine

## 2014-05-24 ENCOUNTER — Encounter (HOSPITAL_COMMUNITY): Payer: Self-pay | Admitting: Emergency Medicine

## 2014-05-24 DIAGNOSIS — Y9389 Activity, other specified: Secondary | ICD-10-CM | POA: Diagnosis not present

## 2014-05-24 DIAGNOSIS — Z794 Long term (current) use of insulin: Secondary | ICD-10-CM | POA: Insufficient documentation

## 2014-05-24 DIAGNOSIS — Z79899 Other long term (current) drug therapy: Secondary | ICD-10-CM | POA: Diagnosis not present

## 2014-05-24 DIAGNOSIS — S20212A Contusion of left front wall of thorax, initial encounter: Secondary | ICD-10-CM | POA: Diagnosis not present

## 2014-05-24 DIAGNOSIS — Z872 Personal history of diseases of the skin and subcutaneous tissue: Secondary | ICD-10-CM | POA: Insufficient documentation

## 2014-05-24 DIAGNOSIS — K219 Gastro-esophageal reflux disease without esophagitis: Secondary | ICD-10-CM | POA: Insufficient documentation

## 2014-05-24 DIAGNOSIS — E785 Hyperlipidemia, unspecified: Secondary | ICD-10-CM | POA: Diagnosis not present

## 2014-05-24 DIAGNOSIS — S3992XA Unspecified injury of lower back, initial encounter: Secondary | ICD-10-CM | POA: Diagnosis present

## 2014-05-24 DIAGNOSIS — Z7982 Long term (current) use of aspirin: Secondary | ICD-10-CM | POA: Insufficient documentation

## 2014-05-24 DIAGNOSIS — F329 Major depressive disorder, single episode, unspecified: Secondary | ICD-10-CM | POA: Insufficient documentation

## 2014-05-24 DIAGNOSIS — E119 Type 2 diabetes mellitus without complications: Secondary | ICD-10-CM | POA: Insufficient documentation

## 2014-05-24 DIAGNOSIS — E669 Obesity, unspecified: Secondary | ICD-10-CM | POA: Diagnosis not present

## 2014-05-24 DIAGNOSIS — S161XXA Strain of muscle, fascia and tendon at neck level, initial encounter: Secondary | ICD-10-CM | POA: Diagnosis not present

## 2014-05-24 DIAGNOSIS — Y92410 Unspecified street and highway as the place of occurrence of the external cause: Secondary | ICD-10-CM | POA: Insufficient documentation

## 2014-05-24 LAB — CBG MONITORING, ED: Glucose-Capillary: 247 mg/dL — ABNORMAL HIGH (ref 70–99)

## 2014-05-24 MED ORDER — OXYCODONE-ACETAMINOPHEN 5-325 MG PO TABS: 1.0000 | ORAL_TABLET | Freq: Four times a day (QID) | ORAL | Status: DC | PRN

## 2014-05-24 MED ORDER — HYDROCODONE-ACETAMINOPHEN 5-325 MG PO TABS
1.0000 | ORAL_TABLET | Freq: Once | ORAL | Status: AC
  Administered 2014-05-24: 1 via ORAL
  Filled 2014-05-24: qty 1

## 2014-05-24 MED ORDER — IBUPROFEN 600 MG PO TABS: 600.0000 mg | ORAL_TABLET | Freq: Four times a day (QID) | ORAL | Status: DC | PRN
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
## 2015-05-29 ENCOUNTER — Emergency Department (HOSPITAL_COMMUNITY): Payer: BLUE CROSS/BLUE SHIELD

## 2015-05-29 ENCOUNTER — Emergency Department (HOSPITAL_COMMUNITY)
Admission: EM | Admit: 2015-05-29 | Discharge: 2015-05-29 | Disposition: A | Discharge status: Home / Self Care | Payer: BLUE CROSS/BLUE SHIELD | Attending: Emergency Medicine | Admitting: Emergency Medicine

## 2015-05-29 ENCOUNTER — Encounter (HOSPITAL_COMMUNITY): Payer: Self-pay | Admitting: Emergency Medicine

## 2015-05-29 DIAGNOSIS — F329 Major depressive disorder, single episode, unspecified: Secondary | ICD-10-CM | POA: Diagnosis not present

## 2015-05-29 DIAGNOSIS — Z7982 Long term (current) use of aspirin: Secondary | ICD-10-CM | POA: Insufficient documentation

## 2015-05-29 DIAGNOSIS — M545 Low back pain: Secondary | ICD-10-CM | POA: Diagnosis present

## 2015-05-29 DIAGNOSIS — K219 Gastro-esophageal reflux disease without esophagitis: Secondary | ICD-10-CM | POA: Diagnosis not present

## 2015-05-29 DIAGNOSIS — Z794 Long term (current) use of insulin: Secondary | ICD-10-CM | POA: Diagnosis not present

## 2015-05-29 DIAGNOSIS — E669 Obesity, unspecified: Secondary | ICD-10-CM | POA: Insufficient documentation

## 2015-05-29 DIAGNOSIS — Z79899 Other long term (current) drug therapy: Secondary | ICD-10-CM | POA: Insufficient documentation

## 2015-05-29 DIAGNOSIS — E119 Type 2 diabetes mellitus without complications: Secondary | ICD-10-CM | POA: Insufficient documentation

## 2015-05-29 DIAGNOSIS — Z8669 Personal history of other diseases of the nervous system and sense organs: Secondary | ICD-10-CM | POA: Insufficient documentation

## 2015-05-29 DIAGNOSIS — Z8619 Personal history of other infectious and parasitic diseases: Secondary | ICD-10-CM | POA: Insufficient documentation

## 2015-05-29 DIAGNOSIS — E785 Hyperlipidemia, unspecified: Secondary | ICD-10-CM | POA: Diagnosis not present

## 2015-05-29 MED ORDER — KETOROLAC TROMETHAMINE 60 MG/2ML IM SOLN
60.0000 mg | Freq: Once | INTRAMUSCULAR | Status: AC
  Administered 2015-05-29: 60 mg via INTRAMUSCULAR
  Filled 2015-05-29: qty 2

## 2015-05-29 MED ORDER — METHOCARBAMOL 500 MG PO TABS: 500.0000 mg | ORAL_TABLET | Freq: Two times a day (BID) | ORAL | Status: DC

## 2015-05-29 MED ORDER — OXYCODONE-ACETAMINOPHEN 5-325 MG PO TABS: 2.0000 | ORAL_TABLET | ORAL | Status: DC | PRN

## 2015-05-29 MED ORDER — HYDROMORPHONE HCL 1 MG/ML IJ SOLN
1.0000 mg | Freq: Once | INTRAMUSCULAR | Status: AC
  Administered 2015-05-29: 1 mg via INTRAMUSCULAR
  Filled 2015-05-29: qty 1

## 2015-05-29 MED ORDER — ONDANSETRON HCL 4 MG/2ML IJ SOLN
4.0000 mg | Freq: Once | INTRAMUSCULAR | Status: AC
  Administered 2015-05-29: 4 mg via INTRAMUSCULAR
  Filled 2015-05-29: qty 2

## 2015-05-29 NOTE — ED Provider Notes (Signed)
CSN: [id number]     Arrival date & time 05/29/15  1537 History  By signing my name below, I, Soijett Blue, attest that this documentation has been prepared under the direction and in the presence of Alyse Low, PA-C Electronically Signed: Soijett Blue, ED Scribe. 05/29/2015. 4:21 PM.   Chief Complaint  Patient presents with  . Back Pain      The history is provided by the patient. No language interpreter was used.    HPI Comments: Ellen Mccoy is a 64 y.o. female with a medical hx of DM who presents to the Emergency Department complaining of increasing lower back pain onset 1 week. She reports that the back pain does radiate to both of her legs and she describes the pain as sharp. She reports that her back pain occurred years ago at a [facility] she states that she will have an intermittent "catch" in her back where she will fall out and that has been occurring for 40 years. She reports that she has informed her PCP Dr. Deatra Ina about the pain but she has not had anything done. She has an orthopedist, Dr. Dorna Leitz but she needs a referral from her PCP to go see him. She states that she is having associated symptoms of gait problem x 2 days due to pain. She notes that she walks with a cane to keep her balance. She states that she has tried OTC patch and 800 mg Ibuprofen at 10 AM with no relief for her symptoms. Pt denies any other symptoms.   Past Medical History  Diagnosis Date  . Depression   . Cataract   . Hepatitis B   . Diabetes mellitus   . Obesity   . Blood transfusion without reported diagnosis   . GERD (gastroesophageal reflux disease)   . Hyperlipidemia    Past Surgical History  Procedure Laterality Date  . Neck surgery    . Abdominal hysterectomy    . Cholecystectomy    . Knee surgery    . Ankle surgery    . Cesarean section    . Appendectomy      not sure, may have been done with hysterectomy  . Cataract extraction w/ intraocular lens implant    . Colonoscopy      Family History  Problem Relation Age of Onset  . Cancer Father   . Diabetes Father   . Colon cancer Cousin   . Diabetes Mother   . Diabetes Maternal Grandfather   . Diabetes Paternal Grandmother    Social History  Substance Use Topics  . Smoking status: Never Smoker   . Smokeless tobacco: Never Used  . Alcohol Use: No   OB History    No data available     Review of Systems  Musculoskeletal: Positive for back pain and gait problem (due to pain). Negative for joint swelling.  Skin: Negative for color change, rash and wound.      Allergies  Review of patient's allergies indicates no known allergies.  Home Medications   Prior to Admission medications   Medication Sig Start Date End Date Taking? Authorizing Provider  Ascorbic Acid (VITAMIN C) 1000 MG tablet Take 1,000 mg by mouth daily.    Historical Provider, MD  aspirin EC 81 MG tablet Take 81 mg by mouth daily.    Historical Provider, MD  B Complex-C (SUPER B COMPLEX PO) Take 1 tablet by mouth daily.    Historical Provider, MD  Cholecalciferol (VITAMIN D) 2000 UNITS CAPS Take  1 capsule by mouth daily.    Historical Provider, MD  Empagliflozin (JARDIANCE) 10 MG TABS Take 1 tablet by mouth daily.    Historical Provider, MD  esomeprazole (NEXIUM) 20 MG capsule Take 22.3 mg by mouth every morning.    Historical Provider, MD  fish oil-omega-3 fatty acids 1000 MG capsule Take 1 g by mouth 2 (two) times daily.    Historical Provider, MD  hydrochlorothiazide (HYDRODIURIL) 25 MG tablet Take 25 mg by mouth daily. For fluid    Historical Provider, MD  ibuprofen (ADVIL,MOTRIN) 600 MG tablet Take 1 tablet (600 mg total) by mouth every 6 (six) hours as needed. 05/24/14   Merryl Hacker, MD  insulin glargine (LANTUS) 100 UNIT/ML injection Inject 60 Units into the skin at bedtime.     Historical Provider, MD  insulin regular (NOVOLIN R,HUMULIN R) 100 units/mL injection Inject 15-41 Units into the skin 3 (three) times daily before  meals. Per sliding scale; Blood Glucose level 70-90= 15 units; 91-130= 25 units; 131-150=27 units; 151-200= 29 units; 201-250= 31 units; 251-300= 33 units; 301-350= 35 units; 351-400= 37 units; 401-450=39 units; >450= 41 units.    Historical Provider, MD  MILK THISTLE PO Take 250 mg by mouth 2 (two) times daily.     Historical Provider, MD  NON FORMULARY Diabetex PGX once daily    Historical Provider, MD  oxyCODONE-acetaminophen (PERCOCET/ROXICET) 5-325 MG per tablet Take 1 tablet by mouth every 6 (six) hours as needed for moderate pain or severe pain. 05/24/14   Merryl Hacker, MD  pravastatin (PRAVACHOL) 40 MG tablet Take 40 mg by mouth every evening.     Historical Provider, MD  sertraline (ZOLOFT) 100 MG tablet Take 100 mg by mouth daily.    Historical Provider, MD   BP 120/60 mmHg  Pulse 97  Temp(Src) 97.7 F (36.5 C) (Oral)  Resp 20  SpO2 99% Physical Exam  Constitutional: She is oriented to person, place, and time. She appears well-developed and well-nourished. No distress.  HENT:  Head: Normocephalic and atraumatic.  Eyes: EOM are normal.  Neck: Neck supple.  Cardiovascular: Normal rate, regular rhythm and normal heart sounds.  Exam reveals no gallop and no friction rub.   No murmur heard. Pulmonary/Chest: Effort normal and breath sounds normal. No respiratory distress. She has no wheezes. She has no rales.  Musculoskeletal: Normal range of motion.  Neurological: She is alert and oriented to person, place, and time.  Skin: Skin is warm and dry.  Psychiatric: She has a normal mood and affect. Her behavior is normal.  Nursing note and vitals reviewed.   ED Course  Procedures (including critical care time) DIAGNOSTIC STUDIES: Oxygen Saturation is  normal by my interpretation.    COORDINATION OF CARE: 4:17 PM Discussed treatment plan with pt at bedside which includes lumbar spine xray, pain medication, and pt agreed to plan.    Labs Review Labs Reviewed - No data to  display  Imaging Review Dg Lumbar Spine Complete  05/29/2015  CLINICAL DATA:  Acute lower back pain for 3 weeks without reported injury. EXAM: LUMBAR SPINE - COMPLETE 4+ VIEW COMPARISON:  Jan 01, 2012. FINDINGS: No fracture or spondylolisthesis is noted. Stable mild degenerative disc disease is noted at L2-3 with anterior osteophyte formation. Stable severe degenerative disc disease is noted at L5-S1. Degenerative changes seen involving the posterior facet joints posteriorly at L4-5. IMPRESSION: Stable multilevel degenerative disc disease. No acute abnormality seen in the lumbar spine. Electronically Signed   By:  Marijo Conception, M.D.   On: 05/29/2015 16:59   I have personally reviewed and evaluated these images as part of my medical decision-making.   EKG Interpretation None      MDM   Final diagnoses:  Low back pain without sciatica, unspecified back pain laterality    Pt reports some pain relief with torodol, dilaudid and zofran.   Xray reviewed.   Pt has degenerative disc disease at multiple levels.  I advised pt of results.  Pt has pain with walking.  Pt given rx for walker.  Pt advised to see her Orthopaedist for evaluation.   Pt is wanting to have an mri.  Pt advised to discuss with Dr. Berenice Primas.  Pt given rx for percocet and robaxin  Fransico Meadow, PA-C 05/29/15 [place], MD 05/29/15 [phone]

## 2015-05-29 NOTE — Discharge Instructions (Signed)

## 2015-05-29 NOTE — ED Notes (Signed)
Pt c/o lower back pain x1 week, radiating down both legs, unable to walk x2 days. Rates pain 10/10. Ibuprofen 800mg  approximately 1000. OTC patch on same.

## 2015-05-29 NOTE — ED Notes (Signed)
Pt able to stand and pivot to toilet in restroom.

## 2015-05-29 NOTE — ED Notes (Signed)
Patient transported to CT 

## 2015-05-29 NOTE — ED Notes (Signed)
Attempted to ambulate patient in room. Pt able to take 3 step in room and then began to have unsteady gait with two person assist. PA-C made aware of same.

## 2016-01-19 ENCOUNTER — Encounter (HOSPITAL_COMMUNITY): Payer: Self-pay | Admitting: Emergency Medicine

## 2016-01-19 ENCOUNTER — Emergency Department (HOSPITAL_COMMUNITY)
Admission: EM | Admit: 2016-01-19 | Discharge: 2016-01-19 | Disposition: A | Discharge status: Home / Self Care | Payer: BLUE CROSS/BLUE SHIELD | Attending: Emergency Medicine | Admitting: Emergency Medicine

## 2016-01-19 ENCOUNTER — Emergency Department (HOSPITAL_COMMUNITY): Payer: BLUE CROSS/BLUE SHIELD

## 2016-01-19 DIAGNOSIS — Z7982 Long term (current) use of aspirin: Secondary | ICD-10-CM | POA: Diagnosis not present

## 2016-01-19 DIAGNOSIS — E109 Type 1 diabetes mellitus without complications: Secondary | ICD-10-CM

## 2016-01-19 DIAGNOSIS — E785 Hyperlipidemia, unspecified: Secondary | ICD-10-CM | POA: Insufficient documentation

## 2016-01-19 DIAGNOSIS — R519 Headache, unspecified: Secondary | ICD-10-CM

## 2016-01-19 DIAGNOSIS — E1065 Type 1 diabetes mellitus with hyperglycemia: Secondary | ICD-10-CM | POA: Insufficient documentation

## 2016-01-19 DIAGNOSIS — R51 Headache: Secondary | ICD-10-CM | POA: Insufficient documentation

## 2016-01-19 DIAGNOSIS — F329 Major depressive disorder, single episode, unspecified: Secondary | ICD-10-CM | POA: Diagnosis not present

## 2016-01-19 DIAGNOSIS — Z79899 Other long term (current) drug therapy: Secondary | ICD-10-CM | POA: Insufficient documentation

## 2016-01-19 DIAGNOSIS — H6091 Unspecified otitis externa, right ear: Secondary | ICD-10-CM | POA: Diagnosis not present

## 2016-01-19 LAB — CBC
HCT: 46.8 % — ABNORMAL HIGH (ref 36.0–46.0)
Hemoglobin: 14.8 g/dL (ref 12.0–15.0)
MCH: 27.4 pg (ref 26.0–34.0)
MCHC: 31.6 g/dL (ref 30.0–36.0)
MCV: 86.5 fL (ref 78.0–100.0)
PLATELETS: 249 10*3/uL (ref 150–400)
RBC: 5.41 MIL/uL — ABNORMAL HIGH (ref 3.87–5.11)
RDW: 15.4 % (ref 11.5–15.5)
WBC: 8.5 10*3/uL (ref 4.0–10.5)

## 2016-01-19 LAB — URINE MICROSCOPIC-ADD ON
RBC / HPF: NONE SEEN RBC/hpf (ref 0–5)
WBC, UA: NONE SEEN WBC/hpf (ref 0–5)

## 2016-01-19 LAB — BASIC METABOLIC PANEL
Anion gap: 11 (ref 5–15)
BUN: 14 mg/dL (ref 6–20)
CO2: 26 mmol/L (ref 22–32)
CREATININE: 1.03 mg/dL — AB (ref 0.44–1.00)
Calcium: 8.9 mg/dL (ref 8.9–10.3)
Chloride: 101 mmol/L (ref 101–111)
GFR, EST NON AFRICAN AMERICAN: 56 mL/min — AB (ref >60)
Glucose, Bld: 302 mg/dL — ABNORMAL HIGH (ref 65–99)
Potassium: 3.5 mmol/L (ref 3.5–5.1)
SODIUM: 138 mmol/L (ref 135–145)

## 2016-01-19 LAB — URINALYSIS, ROUTINE W REFLEX MICROSCOPIC
Bilirubin Urine: NEGATIVE
Glucose, UA: >1000 mg/dL — AB
HGB URINE DIPSTICK: NEGATIVE
Ketones, ur: NEGATIVE mg/dL
LEUKOCYTES UA: NEGATIVE
Nitrite: NEGATIVE
Protein, ur: NEGATIVE mg/dL
Specific Gravity, Urine: 1.019 (ref 1.005–1.030)
pH: 6 (ref 5.0–8.0)

## 2016-01-19 LAB — CBG MONITORING, ED
Glucose-Capillary: 293 mg/dL — ABNORMAL HIGH (ref 65–99)
Glucose-Capillary: 203 mg/dL — ABNORMAL HIGH (ref 65–99)
Glucose-Capillary: 193 mg/dL — ABNORMAL HIGH (ref 65–99)

## 2016-01-19 MED ORDER — DIPHENHYDRAMINE HCL 50 MG/ML IJ SOLN
25.0000 mg | Freq: Once | INTRAMUSCULAR | Status: AC
  Administered 2016-01-19: 25 mg via INTRAVENOUS
  Filled 2016-01-19: qty 1

## 2016-01-19 MED ORDER — CIPROFLOXACIN-DEXAMETHASONE 0.3-0.1 % OT SUSP
4.0000 [drp] | Freq: Once | OTIC | Status: AC
  Administered 2016-01-19: 4 [drp] via OTIC
  Filled 2016-01-19: qty 7.5

## 2016-01-19 MED ORDER — SODIUM CHLORIDE 0.9 % IV BOLUS (SEPSIS)
1000.0000 mL | Freq: Once | INTRAVENOUS | Status: AC
  Administered 2016-01-19: 1000 mL via INTRAVENOUS

## 2016-01-19 MED ORDER — METOCLOPRAMIDE HCL 5 MG/ML IJ SOLN
10.0000 mg | Freq: Once | INTRAMUSCULAR | Status: AC
  Administered 2016-01-19: 10 mg via INTRAVENOUS
  Filled 2016-01-19: qty 2

## 2016-01-19 NOTE — ED Notes (Signed)
Patient transported to CT 

## 2016-01-19 NOTE — ED Notes (Signed)
Pt requesting to leave without discharge paperwork. Ear drops given; no other prescriptions to be provided. Pt ambulatory without difficulty with daughter to discharge

## 2016-01-19 NOTE — ED Notes (Signed)
CBG is 193.

## 2016-01-19 NOTE — Discharge Instructions (Signed)
Take tylenol for headaches.   See your doctor.   Return to ER if you have worse headache, blood sugar > 600 or less than 40, vomiting, fevers, neck pain

## 2016-01-19 NOTE — ED Provider Notes (Signed)
CSN: [id number]     Arrival date & time 01/19/16  1328 History   First MD Initiated Contact with Patient 01/19/16 1822     Chief Complaint  Patient presents with  . Hyperglycemia  . Headache     (Consider location/radiation/quality/duration/timing/severity/associated sxs/prior Treatment) The history is provided by the patient.  Ellen Mccoy is a 65 y.o. female hx of DM on insulin pump, here with headaches. She has headaches for the last week, initially improved with tylenol but getting worse. Headaches in the right side of her head, denies any blurry vision or neck pain. Has right ear pain and noticed ringing in her ears. Has noticed that her blood sugar has been elevated and had been adjusting her insulin pump. Denies abdominal pain or vomiting or fevers.     Past Medical History  Diagnosis Date  . Depression   . Cataract   . Hepatitis B   . Diabetes mellitus   . Obesity   . Blood transfusion without reported diagnosis   . GERD (gastroesophageal reflux disease)   . Hyperlipidemia    Past Surgical History  Procedure Laterality Date  . Neck surgery    . Abdominal hysterectomy    . Cholecystectomy    . Knee surgery    . Ankle surgery    . Cesarean section    . Appendectomy      not sure, may have been done with hysterectomy  . Cataract extraction w/ intraocular lens implant    . Colonoscopy     Family History  Problem Relation Age of Onset  . Cancer Father   . Diabetes Father   . Colon cancer Cousin   . Diabetes Mother   . Diabetes Maternal Grandfather   . Diabetes Paternal Grandmother    Social History  Substance Use Topics  . Smoking status: Never Smoker   . Smokeless tobacco: Never Used  . Alcohol Use: No   OB History    No data available     Review of Systems  Neurological: Positive for headaches.  All other systems reviewed and are negative.     Allergies  Review of patient's allergies indicates no known allergies.  Home Medications   Prior to  Admission medications   Medication Sig Start Date End Date Taking? Authorizing Provider  Ascorbic Acid (VITAMIN C) 1000 MG tablet Take 1,000 mg by mouth daily.   Yes Historical Provider, MD  aspirin EC 81 MG tablet Take 81 mg by mouth daily.   Yes Historical Provider, MD  B Complex-C (SUPER B COMPLEX PO) Take 1 tablet by mouth daily.   Yes Historical Provider, MD  Cholecalciferol (VITAMIN D) 2000 UNITS CAPS Take 1 capsule by mouth daily.   Yes Historical Provider, MD  Dulaglutide (TRULICITY) 1.5 0000000 SOPN Inject 1.5 mg into the skin once a week. Inject into skin every Friday. 12/27/15  Yes Historical Provider, MD  Empagliflozin (JARDIANCE) 10 MG TABS Take 1 tablet by mouth daily.   Yes Historical Provider, MD  escitalopram (LEXAPRO) 10 MG tablet Take 10 mg by mouth. 11/02/15 01/31/16 Yes Historical Provider, MD  fish oil-omega-3 fatty acids 1000 MG capsule Take 1 g by mouth 2 (two) times daily.   Yes Historical Provider, MD  gabapentin (NEURONTIN) 100 MG capsule Take 100 mg by mouth 3 (three) times daily. 11/02/15  Yes Historical Provider, MD  hydrochlorothiazide (HYDRODIURIL) 25 MG tablet Take 25 mg by mouth daily. For fluid   Yes Historical Provider, MD  MILK THISTLE PO  Take 250 mg by mouth 2 (two) times daily.    Yes Historical Provider, MD  pravastatin (PRAVACHOL) 40 MG tablet Take 40 mg by mouth. 11/02/15  Yes Historical Provider, MD  sertraline (ZOLOFT) 100 MG tablet Take 100 mg by mouth daily.   Yes Historical Provider, MD  ibuprofen (ADVIL,MOTRIN) 600 MG tablet Take 1 tablet (600 mg total) by mouth every 6 (six) hours as needed. 05/24/14   Shon Baton, MD  methocarbamol (ROBAXIN) 500 MG tablet Take 1 tablet (500 mg total) by mouth 2 (two) times daily. 05/29/15   Elson Areas, PA-C  oxyCODONE-acetaminophen (PERCOCET/ROXICET) 5-325 MG tablet Take 2 tablets by mouth every 4 (four) hours as needed for severe pain. 05/29/15   Elson Areas, PA-C   BP 128/69 mmHg  Pulse 84  Temp(Src)  98 F (36.7 C) (Oral)  Resp 18  SpO2 98% Physical Exam  Constitutional: She is oriented to person, place, and time. She appears well-developed and well-nourished.  HENT:  Head: Normocephalic.  Mouth/Throat: Oropharynx is clear and moist.  R cerumen impaction, L TM and canal nl   Eyes: Conjunctivae are normal. Pupils are equal, round, and reactive to light.  Neck: Normal range of motion. Neck supple.  No meningeal signs   Cardiovascular: Normal rate, regular rhythm and normal heart sounds.   Pulmonary/Chest: Effort normal and breath sounds normal. No respiratory distress. She has no wheezes. She has no rales.  Abdominal: Soft. Bowel sounds are normal. She exhibits no distension. There is no tenderness. There is no rebound.  Insulin pump site with no obvious cellulitis   Musculoskeletal: Normal range of motion. She exhibits no edema or tenderness.  Neurological: She is alert and oriented to person, place, and time. No cranial nerve deficit. Coordination normal.  CN 2-12 intact. Nl strength throughout, nl sensation throughout   Skin: Skin is warm and dry.  Psychiatric: She has a normal mood and affect. Her behavior is normal. Judgment and thought content normal.  Nursing note and vitals reviewed.   ED Course  Procedures (including critical care time)   Ceruminosis is noted.  Wax is removed by syringing and manual debridement. Instructions for home care to prevent wax buildup are given.  Labs Review Labs Reviewed  BASIC METABOLIC PANEL - Abnormal; Notable for the following:    Glucose, Bld 302 (*)    Creatinine, Ser 1.03 (*)    GFR calc non Af Amer 56 (*)    All other components within normal limits  CBC - Abnormal; Notable for the following:    RBC 5.41 (*)    HCT 46.8 (*)    All other components within normal limits  URINALYSIS, ROUTINE W REFLEX MICROSCOPIC (NOT AT [facility]) - Abnormal; Notable for the following:    Glucose, UA >1000 (*)    All other components within normal  limits  URINE MICROSCOPIC-ADD ON - Abnormal; Notable for the following:    Squamous Epithelial / LPF 0-5 (*)    Bacteria, UA RARE (*)    All other components within normal limits  CBG MONITORING, ED - Abnormal; Notable for the following:    Glucose-Capillary 293 (*)    All other components within normal limits  CBG MONITORING, ED - Abnormal; Notable for the following:    Glucose-Capillary 203 (*)    All other components within normal limits  CBG MONITORING, ED - Abnormal; Notable for the following:    Glucose-Capillary 193 (*)    All other components within normal limits  Imaging Review Ct Head Wo Contrast  01/19/2016  CLINICAL DATA:  Right-sided headache for 10 days EXAM: CT HEAD WITHOUT CONTRAST TECHNIQUE: Contiguous axial images were obtained from the base of the skull through the vertex without intravenous contrast. COMPARISON:  07/25/2013 FINDINGS: Bony calvarium is intact. Chronic changes of the left maxillary antrum are again seen. Very mild atrophic changes are seen. No findings to suggest acute hemorrhage, acute infarction or space-occupying mass lesion are noted. IMPRESSION: No acute abnormality noted. Electronically Signed   By: Inez Catalina M.D.   On: 01/19/2016 21:36   I have personally reviewed and evaluated these images and lab results as part of my medical decision-making.   EKG Interpretation None      MDM   Final diagnoses:  Nonintractable episodic headache, unspecified headache type  Type 1 diabetes mellitus without complication (HCC)   Ellen Mccoy is a 65 y.o. female here with headache, ringing in the ear, ear pain. Has cerumen impaction. Removed cerumen and there is mild otitis externa and given ciprodex. No signs of malignant otitis externa. Headaches may be from otitis externa or migraines. Given new onset headaches, will get CT head. I am not concerned for posterior stroke. Also hx of DM so will check chemistry.   11 pm Patient felt better with migraine  cocktail. CT head nl. Given ciprodex for otitis externa. Labs showed glucose 300, nl AG, CBG went down to 193 after fluids. UA showed no ketones. Felt better. Will dc home with neuro follow up, ciprodex. Left prior to being given paperwork.     Wandra Arthurs, MD 01/20/16 [phone]

## 2016-01-19 NOTE — ED Notes (Signed)
Patient has insulin pump that was placed in February.   Patient has uncontrolled blood sugars that she has been trying to fix by adjusting her insulin.   Patient unable to give me sugar values for the last few days.  Patient states headache for the last week that is unrelieved by tylenol.   Patient states has also had throat pain and R ear pain x 1 week.   Patient denies other symptoms.

## 2016-02-21 ENCOUNTER — Ambulatory Visit: Payer: BLUE CROSS/BLUE SHIELD | Admitting: Neurology

## 2016-02-22 ENCOUNTER — Encounter: Payer: Self-pay | Admitting: Neurology

## 2016-03-11 ENCOUNTER — Emergency Department (HOSPITAL_COMMUNITY): Payer: BLUE CROSS/BLUE SHIELD

## 2016-03-11 ENCOUNTER — Encounter (HOSPITAL_COMMUNITY): Payer: Self-pay | Admitting: Emergency Medicine

## 2016-03-11 ENCOUNTER — Emergency Department (HOSPITAL_COMMUNITY)
Admission: EM | Admit: 2016-03-11 | Discharge: 2016-03-12 | Disposition: A | Discharge status: Home / Self Care | Payer: BLUE CROSS/BLUE SHIELD | Attending: Emergency Medicine | Admitting: Emergency Medicine

## 2016-03-11 DIAGNOSIS — E119 Type 2 diabetes mellitus without complications: Secondary | ICD-10-CM | POA: Insufficient documentation

## 2016-03-11 DIAGNOSIS — Z7982 Long term (current) use of aspirin: Secondary | ICD-10-CM | POA: Diagnosis not present

## 2016-03-11 DIAGNOSIS — Y9389 Activity, other specified: Secondary | ICD-10-CM | POA: Insufficient documentation

## 2016-03-11 DIAGNOSIS — Y999 Unspecified external cause status: Secondary | ICD-10-CM | POA: Diagnosis not present

## 2016-03-11 DIAGNOSIS — M79671 Pain in right foot: Secondary | ICD-10-CM | POA: Diagnosis present

## 2016-03-11 DIAGNOSIS — Y92410 Unspecified street and highway as the place of occurrence of the external cause: Secondary | ICD-10-CM | POA: Insufficient documentation

## 2016-03-11 DIAGNOSIS — S9031XA Contusion of right foot, initial encounter: Secondary | ICD-10-CM | POA: Diagnosis not present

## 2016-03-11 MED ORDER — TRAMADOL HCL 50 MG PO TABS
50.0000 mg | ORAL_TABLET | Freq: Once | ORAL | Status: AC
  Administered 2016-03-12: 50 mg via ORAL
  Filled 2016-03-11: qty 1

## 2016-03-11 MED ORDER — KETOROLAC TROMETHAMINE 60 MG/2ML IM SOLN
60.0000 mg | Freq: Once | INTRAMUSCULAR | Status: AC
  Administered 2016-03-12: 60 mg via INTRAMUSCULAR
  Filled 2016-03-11: qty 2
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
## 2016-03-12 DIAGNOSIS — S9031XA Contusion of right foot, initial encounter: Secondary | ICD-10-CM | POA: Diagnosis not present

## 2017-10-28 ENCOUNTER — Encounter (HOSPITAL_COMMUNITY): Payer: Self-pay | Admitting: Emergency Medicine

## 2017-10-28 ENCOUNTER — Other Ambulatory Visit: Payer: Self-pay

## 2017-10-28 ENCOUNTER — Emergency Department (HOSPITAL_COMMUNITY)
Admission: EM | Admit: 2017-10-28 | Discharge: 2017-10-29 | Disposition: A | Discharge status: Home / Self Care | Payer: BLUE CROSS/BLUE SHIELD | Attending: Emergency Medicine | Admitting: Emergency Medicine

## 2017-10-28 DIAGNOSIS — Z7982 Long term (current) use of aspirin: Secondary | ICD-10-CM | POA: Insufficient documentation

## 2017-10-28 DIAGNOSIS — R51 Headache: Secondary | ICD-10-CM | POA: Diagnosis present

## 2017-10-28 DIAGNOSIS — M546 Pain in thoracic spine: Secondary | ICD-10-CM | POA: Insufficient documentation

## 2017-10-28 DIAGNOSIS — E119 Type 2 diabetes mellitus without complications: Secondary | ICD-10-CM | POA: Diagnosis not present

## 2017-10-28 DIAGNOSIS — Z79899 Other long term (current) drug therapy: Secondary | ICD-10-CM | POA: Insufficient documentation

## 2017-10-28 DIAGNOSIS — M542 Cervicalgia: Secondary | ICD-10-CM | POA: Diagnosis not present

## 2017-10-28 DIAGNOSIS — M545 Low back pain: Secondary | ICD-10-CM

## 2017-10-28 DIAGNOSIS — Z7984 Long term (current) use of oral hypoglycemic drugs: Secondary | ICD-10-CM | POA: Insufficient documentation
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
## 2017-10-29 ENCOUNTER — Emergency Department (HOSPITAL_COMMUNITY): Payer: BLUE CROSS/BLUE SHIELD

## 2017-10-29 MED ORDER — METHOCARBAMOL 500 MG PO TABS: 500.0000 mg | ORAL_TABLET | Freq: Three times a day (TID) | ORAL | 0 refills | Status: DC

## 2017-10-29 MED ORDER — OXYCODONE-ACETAMINOPHEN 5-325 MG PO TABS
1.0000 | ORAL_TABLET | Freq: Once | ORAL | Status: AC
  Administered 2017-10-29: 1 via ORAL
  Filled 2017-10-29: qty 1

## 2017-10-29 NOTE — ED Notes (Signed)
Pt. To CT via stretcher. 
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
## 2017-11-27 ENCOUNTER — Emergency Department (HOSPITAL_COMMUNITY): Payer: BLUE CROSS/BLUE SHIELD

## 2017-11-27 ENCOUNTER — Other Ambulatory Visit: Payer: Self-pay

## 2017-11-27 ENCOUNTER — Emergency Department (HOSPITAL_COMMUNITY)
Admission: EM | Admit: 2017-11-27 | Discharge: 2017-11-27 | Disposition: A | Discharge status: Home / Self Care | Payer: BLUE CROSS/BLUE SHIELD | Attending: Emergency Medicine | Admitting: Emergency Medicine

## 2017-11-27 ENCOUNTER — Encounter (HOSPITAL_COMMUNITY): Payer: Self-pay | Admitting: Emergency Medicine

## 2017-11-27 DIAGNOSIS — R0981 Nasal congestion: Secondary | ICD-10-CM | POA: Diagnosis not present

## 2017-11-27 DIAGNOSIS — R05 Cough: Secondary | ICD-10-CM | POA: Diagnosis present

## 2017-11-27 DIAGNOSIS — E119 Type 2 diabetes mellitus without complications: Secondary | ICD-10-CM | POA: Insufficient documentation

## 2017-11-27 DIAGNOSIS — Z7984 Long term (current) use of oral hypoglycemic drugs: Secondary | ICD-10-CM | POA: Diagnosis not present

## 2017-11-27 DIAGNOSIS — J40 Bronchitis, not specified as acute or chronic: Secondary | ICD-10-CM | POA: Insufficient documentation

## 2017-11-27 DIAGNOSIS — Z79899 Other long term (current) drug therapy: Secondary | ICD-10-CM | POA: Insufficient documentation

## 2017-11-27 DIAGNOSIS — Z7982 Long term (current) use of aspirin: Secondary | ICD-10-CM | POA: Diagnosis not present

## 2017-11-27 MED ORDER — METHYLPREDNISOLONE 4 MG PO TBPK: ORAL_TABLET | ORAL | 0 refills | Status: DC

## 2017-11-27 MED ORDER — ALBUTEROL SULFATE HFA 108 (90 BASE) MCG/ACT IN AERS
2.0000 | INHALATION_SPRAY | Freq: Once | RESPIRATORY_TRACT | Status: AC
  Administered 2017-11-27: 2 via RESPIRATORY_TRACT
  Filled 2017-11-27: qty 6.7

## 2017-11-27 MED ORDER — HYDROCOD POLST-CPM POLST ER 10-8 MG/5ML PO SUER: 5.0000 mL | Freq: Every evening | ORAL | 0 refills | Status: AC | PRN

## 2017-11-27 MED ORDER — AEROCHAMBER Z-STAT PLUS/MEDIUM MISC
Status: AC
  Administered 2017-11-27: 1
  Filled 2017-11-27: qty 1

## 2017-11-27 NOTE — ED Triage Notes (Signed)
Pt complaint of cough for a few days; "got strangled coughing and had right side pain since."

## 2017-11-27 NOTE — ED Provider Notes (Signed)
[facility] DEPT Provider Note  CSN: [id number] Arrival date & time: 11/27/17 1151  Chief Complaint(s) Cough  HPI Ellen Mccoy is a 67 y.o. female with a history of diabetes on insulin who presents to the emergency department with several weeks of runny nose, nasal congestion, cough.  Patient reports that she was exposed to flulike illness several weeks ago.  States that her URI symptoms and myalgias have improved however the cough persisted.  Cough has been worsening over the past 3 days and is productive of yellowish sputum.  She denies any fevers.  No nausea vomiting.  No abdominal pain.  No diarrhea.  Denies any other physical complaints.  HPI  Past Medical History Past Medical History:  Diagnosis Date  . Blood transfusion without reported diagnosis   . Cataract   . Depression   . Diabetes mellitus   . GERD (gastroesophageal reflux disease)   . Hepatitis B   . Hyperlipidemia   . Obesity    Patient Active Problem List   Diagnosis Date Noted  . Chronic diarrhea 04/30/2014  . Other dysphagia 04/30/2014   Home Medication(s) Prior to Admission medications   Medication Sig Start Date End Date Taking? Authorizing Provider  Ascorbic Acid (VITAMIN C) 1000 MG tablet Take 1,000 mg by mouth daily.    [provider]  aspirin EC 81 MG tablet Take 81 mg by mouth daily.    [provider]  B Complex-C (SUPER B COMPLEX PO) Take 1 tablet by mouth daily.    [provider]  chlorpheniramine-HYDROcodone (TUSSIONEX PENNKINETIC ER) 10-8 MG/5ML SUER Take 5 mLs by mouth at bedtime as needed for up to 5 days for cough. 11/27/17 12/02/17  Fatima Blank, MD  Cholecalciferol (VITAMIN D) 2000 UNITS CAPS Take 1 capsule by mouth daily.    [provider]  Dulaglutide (TRULICITY) 1.5 LE/7.5TZ SOPN Inject 1.5 mg into the skin once a week. Inject into skin every Friday. 12/27/15   [provider]  Empagliflozin (JARDIANCE) 10  MG TABS Take 1 tablet by mouth daily.    [provider]  escitalopram (LEXAPRO) 10 MG tablet Take 10 mg by mouth. 11/02/15 01/31/16  [provider]  fish oil-omega-3 fatty acids 1000 MG capsule Take 1 g by mouth 2 (two) times daily.    [provider]  gabapentin (NEURONTIN) 100 MG capsule Take 100 mg by mouth 3 (three) times daily. 11/02/15   [provider]  hydrochlorothiazide (HYDRODIURIL) 25 MG tablet Take 25 mg by mouth daily. For fluid    [provider]  ibuprofen (ADVIL,MOTRIN) 600 MG tablet Take 1 tablet (600 mg total) by mouth every 6 (six) hours as needed. 05/24/14   Horton, Barbette Hair, MD  methocarbamol (ROBAXIN) 500 MG tablet Take 1 tablet (500 mg total) by mouth 3 (three) times daily. 10/29/17   Kinnie Feil, PA-C  methylPREDNISolone (MEDROL DOSEPAK) 4 MG TBPK tablet Use as directed on the package 11/27/17   Cardama, Grayce Sessions, MD  MILK THISTLE PO Take 250 mg by mouth 2 (two) times daily.     [provider]  oxyCODONE-acetaminophen (PERCOCET/ROXICET) 5-325 MG tablet Take 2 tablets by mouth every 4 (four) hours as needed for severe pain. 05/29/15   Fransico Meadow, PA-C  pravastatin (PRAVACHOL) 40 MG tablet Take 40 mg by mouth. 11/02/15   [provider]  sertraline (ZOLOFT) 100 MG tablet Take 100 mg by mouth daily.    [provider]  Past Surgical History Past Surgical History:  Procedure Laterality Date  . ABDOMINAL HYSTERECTOMY    . ANKLE SURGERY    . APPENDECTOMY     not sure, may have been done with hysterectomy  . CATARACT EXTRACTION W/ INTRAOCULAR LENS IMPLANT    . CESAREAN SECTION    . CHOLECYSTECTOMY    . COLONOSCOPY    . KNEE SURGERY    . NECK SURGERY     Family History Family History  Problem Relation Age of Onset  . Colon cancer Cousin   . Cancer Father    . Diabetes Father   . Diabetes Mother   . Diabetes Maternal Grandfather   . Diabetes Paternal Grandmother     Social History Social History   Tobacco Use  . Smoking status: Never Smoker  . Smokeless tobacco: Never Used  Substance Use Topics  . Alcohol use: No  . Drug use: No   Allergies Patient has no known allergies.  Review of Systems Review of Systems All other systems are reviewed and are negative for acute change except as noted in the HPI  Physical Exam Vital Signs  I have reviewed the triage vital signs BP (!) 151/80 (BP Location: Left Arm)   Pulse (!) 109   Temp 98.5 F (36.9 C) (Oral)   Resp 20   SpO2 97%   Physical Exam  Constitutional: She is oriented to person, place, and time. She appears well-developed and well-nourished. No distress.  HENT:  Head: Normocephalic and atraumatic.  Nose: Nose normal.  Eyes: Pupils are equal, round, and reactive to light. Conjunctivae and EOM are normal. Right eye exhibits no discharge. Left eye exhibits no discharge. No scleral icterus.  Neck: Normal range of motion. Neck supple.  Cardiovascular: Normal rate and regular rhythm. Exam reveals no gallop and no friction rub.  No murmur heard. Pulmonary/Chest: Effort normal. No stridor. No respiratory distress. She has no wheezes. She has rhonchi in the right lower field and the left lower field. She has no rales.  Abdominal: Soft. She exhibits no distension. There is no tenderness.  Musculoskeletal: She exhibits no edema or tenderness.  Neurological: She is alert and oriented to person, place, and time.  Skin: Skin is warm and dry. No rash noted. She is not diaphoretic. No erythema.  Psychiatric: She has a normal mood and affect.  Vitals reviewed.   ED Results and Treatments Labs (all labs ordered are listed, but only abnormal results are displayed) Labs Reviewed - No data to display                                                                                                                        EKG  EKG Interpretation  Date/Time:    Ventricular Rate:    PR Interval:    QRS Duration:   QT Interval:    QTC Calculation:   R Axis:     Text Interpretation:        Radiology Dg Chest  2 View  Result Date: 11/27/2017 CLINICAL DATA:  Shortness of breath, cough and fever for 2 days, history bronchitis, GERD EXAM: CHEST - 2 VIEW COMPARISON:  05/24/2014 FINDINGS: Normal heart size, mediastinal contours, and pulmonary vascularity. Mild peribronchial thickening. Lungs clear. No pleural effusion or pneumothorax. Prior cervical spine fusion. Degenerative disc disease changes thoracic spine. IMPRESSION: Mild bronchitic changes without infiltrate. Electronically Signed   By: Lavonia Dana M.D.   On: 11/27/2017 13:02   Pertinent labs & imaging results that were available during my care of the patient were reviewed by me and considered in my medical decision making (see chart for details).  Medications Ordered in ED Medications  albuterol (PROVENTIL HFA;VENTOLIN HFA) 108 (90 Base) MCG/ACT inhaler 2 puff (has no administration in time range)                                                                                                                                    Procedures Procedures  (including critical care time)  Medical Decision Making / ED Course I have reviewed the nursing notes for this encounter and the patient's prior records (if available in EHR or on provided paperwork).    Patient here with several weeks of productive cough.  Chest x-ray without evidence of pneumonia, pneumothorax.  Is consistent with infectious process such as bronchitis.  The patient appears reasonably screened and/or stabilized for discharge and I doubt any other medical condition or other [facility] requiring further screening, evaluation, or treatment in the ED at this time prior to discharge.  The patient is safe for discharge with strict return precautions.   Final Clinical  Impression(s) / ED Diagnoses Final diagnoses:  Bronchitis   Disposition: Discharge  Condition: Good  I have discussed the results, Dx and Tx plan with the patient who expressed understanding and agree(s) with the plan. Discharge instructions discussed at great length. The patient was given strict return precautions who verbalized understanding of the instructions. No further questions at time of discharge.    ED Discharge Orders        Ordered    methylPREDNISolone (MEDROL DOSEPAK) 4 MG TBPK tablet     11/27/17 1647    chlorpheniramine-HYDROcodone (TUSSIONEX PENNKINETIC ER) 10-8 MG/5ML SUER  At bedtime PRN     11/27/17 1647       Follow Up: Aletha Halim., PA-C [address] [place] [address] [phone]  Schedule an appointment as soon as possible for a visit  in 5-7 days, If symptoms do not improve or  worsen      This chart was dictated using voice recognition software.  Despite best efforts to proofread,  errors can occur which can change the documentation meaning.   Fatima Blank, MD 11/27/17 1651

## 2018-01-22 ENCOUNTER — Encounter: Payer: Self-pay | Admitting: Gastroenterology

## 2018-01-22 ENCOUNTER — Ambulatory Visit: Payer: BLUE CROSS/BLUE SHIELD | Admitting: Gastroenterology

## 2018-01-22 ENCOUNTER — Other Ambulatory Visit: Payer: BLUE CROSS/BLUE SHIELD

## 2018-01-22 VITALS — BP 88/68 | HR 101 | Ht 64.0 in | Wt 240.0 lb

## 2018-01-22 DIAGNOSIS — R197 Diarrhea, unspecified: Secondary | ICD-10-CM

## 2018-01-22 MED ORDER — CHOLESTYRAMINE POWD: 4.0000 g | Freq: Every day | 3 refills | Status: DC

## 2018-01-22 NOTE — Progress Notes (Signed)
Review of pertinent gastrointestinal problems: 1. Adenomatous polyps: 12/2007 colonoscopy Ardis Hughs done for diarrhea; found two polyps (one was >1cm), recommended recall at 3 years. Reminder letters mailed twice and eventual Repeat colonoscopy 07/2013 Dr. Ardis Hughs found two polyps, one was 22mm, both adenomas, she was recommended to have recall colonoscopy at 3 year interval.  2. Chronic diarrhea, colonoscopy above, random colon biopsies were normal.  Symptoms seemed to have started after GB removal.  In 2009 I recommended cholestyramine trial and it helped however when she ran out of her prescription she never called back to refill it.  3. Dysphagia workup 04/2014, EGD was normal.  She was recommended to eat slowly, take small bites and chew very well.   HPI: This is a very pleasant 67 year old woman who was referred to me by Aletha Halim., PA-C  to evaluate diarrhea.    Chief complaint is diarrhea  She is here with her daughter today who helps with history giving. I have seen her before for diarrhea issues.  See the testing above.  It sounds like her bowels have been doing fairly well up until 3 or 4 months ago.  She was having 1 or 2 solid stools daily.  She came down with the flu and then a bacterial bronchitis and was treated with antibiotics for it several months ago.  Also over the past few months she has noticed profuse watery diarrhea once daily.  It can be explosive, it can be incontinent at times.  She says it is very smelly.  She and her daughter confirm that something clearly has changed in the past few months.  The diarrhea is always non-bloody  Her weight has been overall stable for months or years even   Review of systems: Pertinent positive and negative review of systems were noted in the above HPI section. All other review negative.   Past Medical History:  Diagnosis Date  . Blood transfusion without reported diagnosis   . Cataract   . Depression   . Diabetes mellitus    . GERD (gastroesophageal reflux disease)   . Hepatitis B   . Hyperlipidemia   . Obesity     Past Surgical History:  Procedure Laterality Date  . ABDOMINAL HYSTERECTOMY    . ANKLE SURGERY    . APPENDECTOMY     not sure, may have been done with hysterectomy  . CATARACT EXTRACTION W/ INTRAOCULAR LENS IMPLANT    . CESAREAN SECTION    . CHOLECYSTECTOMY    . COLONOSCOPY    . KNEE SURGERY    . NECK SURGERY      Current Outpatient Medications  Medication Sig Dispense Refill  . Ascorbic Acid (VITAMIN C) 1000 MG tablet Take 1,000 mg by mouth daily.    Marland Kitchen aspirin EC 81 MG tablet Take 81 mg by mouth daily.    Marland Kitchen azithromycin (ZITHROMAX) 250 MG tablet Take by mouth daily.    . B Complex-C (SUPER B COMPLEX PO) Take 1 tablet by mouth daily.    . Brinzolamide-Brimonidine (SIMBRINZA) 1-0.2 % SUSP Apply to eye 3 (three) times daily.    . Cholecalciferol (VITAMIN D) 2000 UNITS CAPS Take 1 capsule by mouth daily.    . Continuous Blood Gluc Sensor (FREESTYLE LIBRE 14 DAY SENSOR) MISC by Does not apply route.    . Dulaglutide (TRULICITY) 1.5 OB/0.9GG SOPN Inject 1.5 mg into the skin once a week. Inject into skin every Friday.    . Empagliflozin (JARDIANCE) 10 MG TABS Take 1 tablet  by mouth daily.    . fish oil-omega-3 fatty acids 1000 MG capsule Take 1 g by mouth 2 (two) times daily.    Marland Kitchen glipiZIDE (GLUCOTROL XL) 10 MG 24 hr tablet Take 10 mg by mouth daily with breakfast.    . hydrochlorothiazide (HYDRODIURIL) 25 MG tablet Take 25 mg by mouth daily. For fluid    . HYDROcodone-homatropine (HYCODAN) 5-1.5 MG/5ML syrup Take 5 mLs by mouth every 6 (six) hours as needed for cough.    Marland Kitchen ibuprofen (ADVIL,MOTRIN) 600 MG tablet Take 1 tablet (600 mg total) by mouth every 6 (six) hours as needed. 30 tablet 0  . methylPREDNISolone (MEDROL DOSEPAK) 4 MG TBPK tablet Use as directed on the package 21 tablet 0  . MILK THISTLE PO Take 250 mg by mouth 2 (two) times daily.     Marland Kitchen omeprazole (PRILOSEC) 40 MG capsule  Take 40 mg by mouth daily.    Marland Kitchen oxyCODONE-acetaminophen (PERCOCET/ROXICET) 5-325 MG tablet Take 2 tablets by mouth every 4 (four) hours as needed for severe pain. 20 tablet 0  . pravastatin (PRAVACHOL) 40 MG tablet Take 40 mg by mouth.    . escitalopram (LEXAPRO) 10 MG tablet Take 20 mg by mouth daily.      No current facility-administered medications for this visit.     Allergies as of 01/22/2018  . (No Known Allergies)    Family History  Problem Relation Age of Onset  . Colon cancer Cousin   . Cancer Father   . Diabetes Father   . Diabetes Mother   . Diabetes Maternal Grandfather   . Diabetes Paternal Grandmother     Social History   Socioeconomic History  . Marital status: Married    Spouse name: Not on file  . Number of children: 2  . Years of education: Not on file  . Highest education level: Not on file  Occupational History  . Not on file  Social Needs  . Financial resource strain: Not on file  . Food insecurity:    Worry: Not on file    Inability: Not on file  . Transportation needs:    Medical: Not on file    Non-medical: Not on file  Tobacco Use  . Smoking status: Never Smoker  . Smokeless tobacco: Never Used  Substance and Sexual Activity  . Alcohol use: No  . Drug use: No  . Sexual activity: Not on file  Lifestyle  . Physical activity:    Days per week: Not on file    Minutes per session: Not on file  . Stress: Not on file  Relationships  . Social connections:    Talks on phone: Not on file    Gets together: Not on file    Attends religious service: Not on file    Active member of club or organization: Not on file    Attends meetings of clubs or organizations: Not on file    Relationship status: Not on file  . Intimate partner violence:    Fear of current or ex partner: Not on file    Emotionally abused: Not on file    Physically abused: Not on file    Forced sexual activity: Not on file  Other Topics Concern  . Not on file  Social  History Narrative  . Not on file     Physical Exam: Ht 5\' 4"  (1.626 m)   Wt 240 lb (108.9 kg)   BMI 41.20 kg/m  Constitutional: generally well-appearing Psychiatric: alert and  oriented x3 Eyes: extraocular movements intact Mouth: oral pharynx moist, no lesions Neck: supple no lymphadenopathy Cardiovascular: heart regular rate and rhythm Lungs: clear to auscultation bilaterally Abdomen: soft, nontender, nondistended, no obvious ascites, no peritoneal signs, normal bowel sounds Extremities: no lower extremity edema bilaterally Skin: no lesions on visible extremities   Assessment and plan: 67 y.o. female with diarrhea, change in bowels, personal history of precancerous colon polyps  3 of her current medicines have diarrhea listed as #1 or #2 most common side effects.  These are Trulicity, glipizide, and Pravachol.  These may be playing a role here.  She was also on antibiotics several months ago for what sounds like bronchitis that complicated the flu.  Possibly she now has C. difficile.  Also she has long-standing issues with loose stools that seems to flare at times.  This was worse after her gallbladder was removed.  She has undergone a colonoscopy with random biopsies showed no microscopic changes.  For now she will start cholestyramine 4 g once daily and she will have a stool testing for C. difficile as well as GI pathogen panel.  Pending the results above she will need colonoscopy sometime in the near future as she was due for colon polyp surveillance about a year ago.    Please see the "Patient Instructions" section for addition details about the plan.   Owens Loffler, MD [facility] 01/22/2018, 10:57 AM  Cc: Aletha Halim., PA-C

## 2018-01-22 NOTE — Patient Instructions (Addendum)
The #2 side effect of Trulicity is diarrhea The #2 side effect of Pravachol is diarrhea The #1 side effect of glipizide is diarrhea  You will have labs checked today in the basement lab.  Please head down after you check out with the front desk  (stool for c. Diff by PCR and toxin assay, GI pathogen panel). Cholestyramine powder 4gm once daily, disp 3 months with 3 refills.  You will probably need colonoscopy in next few weeks, will wait for the above test results before scheduling.  Normal BMI (Body Mass Index- based on height and weight) is between 19 and 25. Your BMI today is Body mass index is 41.2 kg/m. Marland Kitchen Please consider follow up  regarding your BMI with your Primary Care Provider.

## 2018-01-23 ENCOUNTER — Other Ambulatory Visit: Payer: BLUE CROSS/BLUE SHIELD

## 2018-01-23 ENCOUNTER — Telehealth: Payer: Self-pay | Admitting: Gastroenterology

## 2018-01-23 DIAGNOSIS — R197 Diarrhea, unspecified: Secondary | ICD-10-CM

## 2018-01-23 NOTE — Telephone Encounter (Signed)
Pt's daughter called back and wants it sent to [facility] on [address]

## 2018-01-23 NOTE — Telephone Encounter (Signed)
I spoke to patient who stated [facility] will not be able to fill RX for Cholestyramine until Monday. Notified [facility] they cary the medication and will fill prescription. Patient notified and will pick up medication.

## 2018-01-27 LAB — GASTROINTESTINAL PATHOGEN PANEL PCR
C. difficile Tox A/B, PCR: NOT DETECTED
CAMPYLOBACTER, PCR: NOT DETECTED
Cryptosporidium, PCR: NOT DETECTED
E coli (ETEC) LT/ST PCR: NOT DETECTED
E coli (STEC) stx1/stx2, PCR: NOT DETECTED
E coli 0157, PCR: NOT DETECTED
GIARDIA LAMBLIA, PCR: NOT DETECTED
Norovirus, PCR: NOT DETECTED
Rotavirus A, PCR: NOT DETECTED
Salmonella, PCR: NOT DETECTED
Shigella, PCR: NOT DETECTED

## 2018-01-27 LAB — CLOSTRIDIUM DIFFICILE TOXIN B, QUALITATIVE, REAL-TIME PCR: Toxigenic C. Difficile by PCR: NOT DETECTED

## 2018-01-28 ENCOUNTER — Other Ambulatory Visit: Payer: Self-pay | Admitting: Gastroenterology

## 2018-01-28 DIAGNOSIS — R197 Diarrhea, unspecified: Secondary | ICD-10-CM

## 2018-01-28 DIAGNOSIS — Z86010 Personal history of colonic polyps: Secondary | ICD-10-CM

## 2018-01-28 MED ORDER — PEG 3350-KCL-NA BICARB-NACL 420 G PO SOLR: 4000.0000 mL | ORAL | 0 refills | Status: DC

## 2018-04-03 ENCOUNTER — Ambulatory Visit (AMBULATORY_SURGERY_CENTER): Payer: Self-pay

## 2018-04-03 ENCOUNTER — Telehealth: Payer: Self-pay

## 2018-04-03 VITALS — Ht 64.0 in | Wt 240.8 lb

## 2018-04-03 DIAGNOSIS — Z86010 Personal history of colonic polyps: Secondary | ICD-10-CM

## 2018-04-03 DIAGNOSIS — R197 Diarrhea, unspecified: Secondary | ICD-10-CM

## 2018-04-03 NOTE — Progress Notes (Signed)
Per pt, no allergies to soy or egg products.Pt not taking any weight loss meds or using  O2 at home.  Pt refused emmi video  During the PV, the pt states she has an  Insulin pump which is regulated by Dr Peri Jefferson in [place]. Will have Dr Ardis Hughs CMA send a letter for specific instructions for the insulin pump. The pt will call our office if she does receive any instructions regarding the insulin pump.

## 2018-04-03 NOTE — Telephone Encounter (Signed)
Ellen Mccoy can you please send the insulin pump letter?  Sharion Dove is now Dr Ardis Hughs "CMA" in the office.  Thanks

## 2018-04-03 NOTE — Telephone Encounter (Signed)
Ellen Mccoy, This pt is scheduled for her colon with Dr Ardis Hughs on 04/18/18. She informed me she has an" insulin pump" which is regulated by Dr. Peri Jefferson in [place] at [facility].  The phone # is [phone], fax # is [phone]. Can you please get the instructions for the insulin pump  prior to the colon on 04/18/18 and contact the pt.  The pt is aware to call our office if no one has notified her.Thanks, Gwyndolyn Saxon

## 2018-04-03 NOTE — Telephone Encounter (Signed)
Instruction letter for the insulin pump prior to colonoscopy has been faxed over to Dr Peri Jefferson. I also notified the office to let them know it has been sent.

## 2018-04-04 ENCOUNTER — Encounter: Payer: Self-pay | Admitting: Gastroenterology

## 2018-04-08 ENCOUNTER — Telehealth: Payer: Self-pay

## 2018-04-08 NOTE — Telephone Encounter (Signed)
I received instructions back from Autumn jones PA-C regarding insulin pump instructions prior to 04/18/18 colonoscopy. I discussed with patient that Autumn recommends to run a basal temp of 50%when she starts her clear liquid diet /prep until procedure  Is complete. Patient voiced understanding and has an appointment with Autumn on 04/11/18.They will again go over all instructions at that visit.

## 2018-04-10 ENCOUNTER — Telehealth: Payer: Self-pay | Admitting: Gastroenterology

## 2018-04-11 NOTE — Telephone Encounter (Signed)
Spoke to patient and she will call the billing department at [facility] and have them indicate her labs are under [facility] and not [facility]

## 2018-04-15 NOTE — Telephone Encounter (Signed)
Ellen Mccoy, Could you please follow up with the instructions for the insulin pump on this pt. Her colon is 04/18/18. Thanks. Gwyndolyn Saxon

## 2018-04-15 NOTE — Telephone Encounter (Signed)
Ellen Mccoy,I spoke with the the patient on 04/08/18 after getting the insulin pump instructions from Peri Jefferson PA. She voiced understanding and stated she had an appointment on 04/11/18 to see Peri Jefferson PA. I called her this morning and left a voicemail checking to make sure she understood everything  Prior to her 04/18/18 colon.  Claiborne Billings

## 2018-04-16 ENCOUNTER — Telehealth: Payer: Self-pay | Admitting: Gastroenterology

## 2018-04-16 NOTE — Telephone Encounter (Signed)
Ok, thanks.

## 2018-04-18 ENCOUNTER — Encounter: Payer: BLUE CROSS/BLUE SHIELD | Admitting: Gastroenterology

## 2018-07-27 ENCOUNTER — Encounter: Payer: Self-pay | Admitting: Gastroenterology

## 2018-10-04 ENCOUNTER — Inpatient Hospital Stay (HOSPITAL_COMMUNITY)
Admission: EM | Admit: 2018-10-04 | Discharge: 2018-10-07 | DRG: 872 | Disposition: A | Discharge status: Home / Self Care | Payer: Medicare Other | Attending: Internal Medicine | Admitting: Internal Medicine

## 2018-10-04 ENCOUNTER — Other Ambulatory Visit: Payer: Self-pay

## 2018-10-04 ENCOUNTER — Encounter (HOSPITAL_COMMUNITY): Payer: Self-pay | Admitting: Emergency Medicine

## 2018-10-04 ENCOUNTER — Emergency Department (HOSPITAL_COMMUNITY): Payer: Medicare Other

## 2018-10-04 DIAGNOSIS — N3000 Acute cystitis without hematuria: Secondary | ICD-10-CM | POA: Diagnosis present

## 2018-10-04 DIAGNOSIS — A4151 Sepsis due to Escherichia coli [E. coli]: Principal | ICD-10-CM | POA: Diagnosis present

## 2018-10-04 DIAGNOSIS — N10 Acute tubulo-interstitial nephritis: Secondary | ICD-10-CM | POA: Diagnosis present

## 2018-10-04 DIAGNOSIS — E118 Type 2 diabetes mellitus with unspecified complications: Secondary | ICD-10-CM | POA: Diagnosis present

## 2018-10-04 DIAGNOSIS — K529 Noninfective gastroenteritis and colitis, unspecified: Secondary | ICD-10-CM | POA: Diagnosis present

## 2018-10-04 DIAGNOSIS — Z7982 Long term (current) use of aspirin: Secondary | ICD-10-CM

## 2018-10-04 DIAGNOSIS — T383X5A Adverse effect of insulin and oral hypoglycemic [antidiabetic] drugs, initial encounter: Secondary | ICD-10-CM

## 2018-10-04 DIAGNOSIS — Z833 Family history of diabetes mellitus: Secondary | ICD-10-CM

## 2018-10-04 DIAGNOSIS — A419 Sepsis, unspecified organism: Secondary | ICD-10-CM | POA: Diagnosis present

## 2018-10-04 DIAGNOSIS — I10 Essential (primary) hypertension: Secondary | ICD-10-CM | POA: Diagnosis present

## 2018-10-04 DIAGNOSIS — Z9641 Presence of insulin pump (external) (internal): Secondary | ICD-10-CM | POA: Diagnosis present

## 2018-10-04 DIAGNOSIS — E160 Drug-induced hypoglycemia without coma: Secondary | ICD-10-CM | POA: Diagnosis present

## 2018-10-04 DIAGNOSIS — E1140 Type 2 diabetes mellitus with diabetic neuropathy, unspecified: Secondary | ICD-10-CM | POA: Diagnosis present

## 2018-10-04 DIAGNOSIS — E785 Hyperlipidemia, unspecified: Secondary | ICD-10-CM | POA: Diagnosis present

## 2018-10-04 DIAGNOSIS — E11649 Type 2 diabetes mellitus with hypoglycemia without coma: Secondary | ICD-10-CM | POA: Diagnosis present

## 2018-10-04 DIAGNOSIS — E11319 Type 2 diabetes mellitus with unspecified diabetic retinopathy without macular edema: Secondary | ICD-10-CM | POA: Diagnosis present

## 2018-10-04 DIAGNOSIS — E860 Dehydration: Secondary | ICD-10-CM | POA: Diagnosis present

## 2018-10-04 LAB — URINALYSIS, ROUTINE W REFLEX MICROSCOPIC
Bilirubin Urine: NEGATIVE
Glucose, UA: NEGATIVE mg/dL
Ketones, ur: NEGATIVE mg/dL
Nitrite: NEGATIVE
Protein, ur: 100 mg/dL — AB
Specific Gravity, Urine: 1.012 (ref 1.005–1.030)
WBC, UA: >50 WBC/hpf — ABNORMAL HIGH (ref 0–5)
pH: 6 (ref 5.0–8.0)

## 2018-10-04 LAB — INFLUENZA PANEL BY PCR (TYPE A & B)
Influenza A By PCR: NEGATIVE
Influenza B By PCR: NEGATIVE

## 2018-10-04 LAB — CBC WITH DIFFERENTIAL/PLATELET
Abs Immature Granulocytes: 0.01 10*3/uL (ref 0.00–0.07)
Basophils Absolute: 0 10*3/uL (ref 0.0–0.1)
Basophils Relative: 0 %
EOS PCT: 0 %
Eosinophils Absolute: 0 10*3/uL (ref 0.0–0.5)
HCT: 43 % (ref 36.0–46.0)
Hemoglobin: 13.5 g/dL (ref 12.0–15.0)
Immature Granulocytes: 0 %
Lymphocytes Relative: 20 %
Lymphs Abs: 1 10*3/uL (ref 0.7–4.0)
MCH: 30.3 pg (ref 26.0–34.0)
MCHC: 31.4 g/dL (ref 30.0–36.0)
MCV: 96.6 fL (ref 80.0–100.0)
Monocytes Absolute: 0.1 10*3/uL (ref 0.1–1.0)
Monocytes Relative: 1 %
Neutro Abs: 3.9 10*3/uL (ref 1.7–7.7)
Neutrophils Relative %: 79 %
Platelets: 186 10*3/uL (ref 150–400)
RBC: 4.45 MIL/uL (ref 3.87–5.11)
RDW: 13.4 % (ref 11.5–15.5)
WBC: 5 10*3/uL (ref 4.0–10.5)
nRBC: 0 % (ref 0.0–0.2)

## 2018-10-04 LAB — CBG MONITORING, ED
GLUCOSE-CAPILLARY: 52 mg/dL — AB (ref 70–99)
Glucose-Capillary: 63 mg/dL — ABNORMAL LOW (ref 70–99)
Glucose-Capillary: 188 mg/dL — ABNORMAL HIGH (ref 70–99)

## 2018-10-04 LAB — LACTIC ACID, PLASMA: Lactic Acid, Venous: 1.7 mmol/L (ref 0.5–1.9)

## 2018-10-04 LAB — LIPASE, BLOOD: LIPASE: 30 U/L (ref 11–51)

## 2018-10-04 LAB — COMPREHENSIVE METABOLIC PANEL
ALT: 52 U/L — ABNORMAL HIGH (ref 0–44)
AST: 63 U/L — ABNORMAL HIGH (ref 15–41)
Albumin: 3.7 g/dL (ref 3.5–5.0)
Alkaline Phosphatase: 109 U/L (ref 38–126)
Anion gap: 12 (ref 5–15)
BUN: 15 mg/dL (ref 8–23)
CO2: 25 mmol/L (ref 22–32)
Calcium: 8.6 mg/dL — ABNORMAL LOW (ref 8.9–10.3)
Chloride: 101 mmol/L (ref 98–111)
Creatinine, Ser: 1.19 mg/dL — ABNORMAL HIGH (ref 0.44–1.00)
GFR calc Af Amer: 54 mL/min — ABNORMAL LOW (ref >60)
GFR, EST NON AFRICAN AMERICAN: 47 mL/min — AB (ref >60)
Glucose, Bld: 104 mg/dL — ABNORMAL HIGH (ref 70–99)
Potassium: 3.6 mmol/L (ref 3.5–5.1)
Sodium: 138 mmol/L (ref 135–145)
Total Bilirubin: 1 mg/dL (ref 0.3–1.2)
Total Protein: 7.1 g/dL (ref 6.5–8.1)

## 2018-10-04 MED ORDER — VANCOMYCIN HCL IN DEXTROSE 1-5 GM/200ML-% IV SOLN: 1000.0000 mg | Freq: Once | INTRAVENOUS | Status: DC

## 2018-10-04 MED ORDER — METRONIDAZOLE IN NACL 5-0.79 MG/ML-% IV SOLN
500.0000 mg | Freq: Three times a day (TID) | INTRAVENOUS | Status: DC
  Administered 2018-10-04 – 2018-10-05 (×2): 500 mg via INTRAVENOUS
  Filled 2018-10-04 (×2): qty 100

## 2018-10-04 MED ORDER — SODIUM CHLORIDE 0.9 % IV SOLN
2.0000 g | Freq: Once | INTRAVENOUS | Status: AC
  Administered 2018-10-04: 2 g via INTRAVENOUS
  Filled 2018-10-04: qty 2

## 2018-10-04 MED ORDER — SODIUM CHLORIDE 0.9 % IV BOLUS
1000.0000 mL | Freq: Once | INTRAVENOUS | Status: AC
  Administered 2018-10-04: 1000 mL via INTRAVENOUS

## 2018-10-04 MED ORDER — SODIUM CHLORIDE 0.9 % IV SOLN
INTRAVENOUS | Status: DC
  Administered 2018-10-04 – 2018-10-06 (×4): via INTRAVENOUS

## 2018-10-04 MED ORDER — SODIUM CHLORIDE 0.9 % IV BOLUS (SEPSIS): 1000.0000 mL | Freq: Once | INTRAVENOUS | Status: DC

## 2018-10-04 MED ORDER — SODIUM CHLORIDE 0.9 % IV BOLUS (SEPSIS)
500.0000 mL | Freq: Once | INTRAVENOUS | Status: AC
  Administered 2018-10-05: 500 mL via INTRAVENOUS

## 2018-10-04 MED ORDER — ACETAMINOPHEN 325 MG PO TABS
ORAL_TABLET | ORAL | Status: AC
  Administered 2018-10-04: 650 mg
  Filled 2018-10-04: qty 2

## 2018-10-04 MED ORDER — METOCLOPRAMIDE HCL 5 MG/ML IJ SOLN
5.0000 mg | Freq: Once | INTRAMUSCULAR | Status: AC
  Administered 2018-10-04: 5 mg via INTRAVENOUS
  Filled 2018-10-04: qty 2

## 2018-10-04 MED ORDER — DEXTROSE 50 % IV SOLN
1.0000 | Freq: Once | INTRAVENOUS | Status: AC
  Administered 2018-10-04: 50 mL via INTRAVENOUS
  Filled 2018-10-04: qty 50

## 2018-10-04 MED ORDER — VANCOMYCIN HCL 10 G IV SOLR
2000.0000 mg | Freq: Once | INTRAVENOUS | Status: AC
  Administered 2018-10-04: 2000 mg via INTRAVENOUS
  Filled 2018-10-04: qty 2000

## 2018-10-04 MED ORDER — GLUCOSE 40 % PO GEL
1.0000 | ORAL | Status: DC | PRN
  Administered 2018-10-04: 37.5 g via ORAL
  Filled 2018-10-04: qty 1

## 2018-10-04 NOTE — H&P (Signed)
Ellen Mccoy KDT:[id number] DOB: [birth year] DOA: 10/04/2018     PCP: Aletha Halim., PA-C   Outpatient Specialists:    Endocrinology Peri Jefferson Patient arrived to ER on 10/04/18 at 2149  Patient coming from: home Lives With family    Chief Complaint:  Chief Complaint  Patient presents with  . Hypoglycemia    HPI: Ellen Mccoy is a 68 y.o. female with medical history significant of DM 2 on a T-Slim insulin pump.  complicated by Retinopathy, neuropathy and HLD     Presented with nausea and vomiting abdominal discomfort as well as back pain.  Also reports low blood sugars at home.    On 27th patient's family called and to PCP stating that she has urinary tract infection symptoms 3 days ago with dysuria, back pain and increased frequency Family  asked for prescription she was ordered Macrobid. She had it shipped to her and only had one dose today. Daughter tried to give her Azor but did not seem to help.  While she was in [place] patient noticed that her blood sugars are starting to run low she could not use her insulin pump because was malfunctioning and she turned it off.  Her last blood sugar when she checked it was 79 She recalls recollect some cough and congestion and chills no fever at home  She has chronic vertigo and staggers    Reports chronic diarrhea for the past few years have been seen by Dr. Ardis Hughs reports have had neg Colonoscopy and endoscopy Reports almost every day she has sudden onset of foul smelling diarrhea  She drinks up to 2 cups of coffee a day no chest pain no shortness of breath   Regarding pertinent Chronic problems: DM 2 followed by endocrinology on insulin pump and glipizide 10 mg twice daily  Her Levemir dose had to be adjusted up throughout January and she had a cortical steroid injections   stopped her Trulicity and Jardiance due to cost concerns Farxiga caused UTI and yeast infection metformin ER, stopped for chronic diarrhea.     Basal rate as follows: MN 1.0; 6am 2.3; 9am 4.75; 12pm 4.75; 3pm 6.0; 6pm 4.25; 10pm 3.75.  Insulin:carb ratio at MN 1u:3g; 9am 1u:2.2g; 12pm 1u:1.8g; 6pm 1u:1.5g; 10pm 1u:3.5g.  .    in Jan  A1C was 9.5% today, down from 9.8% previously     While in ER: CXR neg Noted to be febrile up to 103.9 and tachycardic up to 132 UA showing significant urinary tract infection Influenza  Negative Darted on vancomycin and cefepime Initially blood glucose down to improved to 188 with treatment oral gel and D50 The following Work up has been ordered so far:  Orders Placed This Encounter  Procedures  . Urine Culture  . Culture, blood (Routine X 2) w Reflex to ID Panel  . DG Chest Port 1 View  . CBC with Differential/Platelet  . Comprehensive metabolic panel  . Lipase, blood  . Urinalysis, Routine w reflex microscopic  . Influenza panel by PCR (type A & B)  . Lactic acid, plasma  . Consult to hospitalist  . CBG monitoring, ED  . CBG monitoring, ED  . CBG monitoring, ED  . EKG 12-Lead     Following Medications were ordered in ER: Medications  dextrose (GLUTOSE) 40 % oral gel 37.5 g (37.5 g Oral Given 10/04/18 2158)  0.9 %  sodium chloride infusion ( Intravenous New Bag/Given 10/04/18 2300)  metroNIDAZOLE (FLAGYL) IVPB  500 mg (500 mg Intravenous New Bag/Given 10/04/18 2307)  vancomycin (VANCOCIN) 2,000 mg in sodium chloride 0.9 % 500 mL IVPB (has no administration in time range)  sodium chloride 0.9 % bolus 1,000 mL (1,000 mLs Intravenous New Bag/Given 10/04/18 2249)  dextrose 50 % solution 50 mL (50 mLs Intravenous Given 10/04/18 2248)  metoCLOPramide (REGLAN) injection 5 mg (5 mg Intravenous Given 10/04/18 2248)  ceFEPIme (MAXIPIME) 2 g in sodium chloride 0.9 % 100 mL IVPB (2 g Intravenous New Bag/Given 10/04/18 2300)  acetaminophen (TYLENOL) 325 MG tablet (650 mg  Given 10/04/18 2301)    Significant initial  Findings: Abnormal Labs Reviewed  COMPREHENSIVE METABOLIC PANEL - Abnormal;  Notable for the following components:      Result Value   Glucose, Bld 104 (*)    Creatinine, Ser 1.19 (*)    Calcium 8.6 (*)    AST 63 (*)    ALT 52 (*)    GFR calc non Af Amer 47 (*)    GFR calc Af Amer 54 (*)    All other components within normal limits  CBG MONITORING, ED - Abnormal; Notable for the following components:   Glucose-Capillary 52 (*)    All other components within normal limits  CBG MONITORING, ED - Abnormal; Notable for the following components:   Glucose-Capillary 63 (*)    All other components within normal limits  CBG MONITORING, ED - Abnormal; Notable for the following components:   Glucose-Capillary 188 (*)    All other components within normal limits    Lactic Acid, Venous    Component Value Date/Time   LATICACIDVEN 1.7 10/04/2018 2301    Na 138 K 3.6  Cr    stable,   Lab Results  Component Value Date   CREATININE 1.19 (H) 10/04/2018   CREATININE 1.03 (H) 01/19/2016   CREATININE 0.9 11/25/2007    WBC  5.0  HG/HCT   stable,      Component Value Date/Time   HGB 13.5 10/04/2018 2219   HCT 43.0 10/04/2018 2219    Lipase 30     UA   evidence of UTI       CTabd/pelvis - Ordered  ECG:  ordered    ED Triage Vitals  Enc Vitals Group     BP 10/04/18 2255 (!) 172/75     Pulse Rate 10/04/18 2255 (!) 134     Resp 10/04/18 2255 19     Temp 10/04/18 2241 (!) 103.9 F (39.9 C)     Temp Source 10/04/18 2241 Rectal     SpO2 10/04/18 2255 94 %     Weight 10/04/18 2200 240 lb (108.9 kg)     Height 10/04/18 2200 '5\' 4"'$  (1.626 m)     Head Circumference --      Peak Flow --      Pain Score --      Pain Loc --      Pain Edu? --      Excl. in [place]? --   TMAX(24)@       Latest  Blood pressure (!) 178/62, pulse (!) 132, temperature (!) 103.9 F (39.9 C), temperature source Rectal, resp. rate (!) 23, height '5\' 4"'$  (1.626 m), weight 108.9 kg, SpO2 95 %.      Hospitalist was called for admission for Sepsis   Review of Systems:    Pertinent  positives include: chills, fatigue, diarrhea,  nausea,loss of appetite, dysuria,  urgency or frequency.  Constitutional:  No weight loss, night  sweats, Fevers, weight loss  HEENT:  No headaches, Difficulty swallowing,Tooth/dental problems,Sore throat,  No sneezing, itching, ear ache, nasal congestion, post nasal drip,  Cardio-vascular:  No chest pain, Orthopnea, PND, anasarca, dizziness, palpitations.no Bilateral lower extremity swelling  GI:  No heartburn, indigestion, abdominal pain, vomiting,  change in bowel habits,, melena, blood in stool, hematemesis Resp:  no shortness of breath at rest. No dyspnea on exertion, No excess mucus, no productive cough, No non-productive cough, No coughing up of blood.No change in color of mucus.No wheezing. Skin:  no rash or lesions. No jaundice GU:  no change in color of urine, no No straining to urinate.  No flank pain.  Musculoskeletal:  No joint pain or no joint swelling. No decreased range of motion. No back pain.  Psych:  No change in mood or affect. No depression or anxiety. No memory loss.  Neuro: no localizing neurological complaints, no tingling, no weakness, no double vision, no gait abnormality, no slurred speech, no confusion  All systems reviewed and apart from [place] all are negative  Past Medical History:   Past Medical History:  Diagnosis Date  . Blood transfusion without reported diagnosis 1978   after c section  . Cataract   . Depression   . Diabetes mellitus   . Diarrhea   . GERD (gastroesophageal reflux disease)   . Glaucoma   . Hepatitis B    after blood transfusion  . Hyperlipidemia   . Hypertension   . Obesity      Past Surgical History:  Procedure Laterality Date  . ABDOMINAL HYSTERECTOMY    . ANKLE SURGERY    . APPENDECTOMY     not sure, may have been done with hysterectomy  . CATARACT EXTRACTION W/ INTRAOCULAR LENS IMPLANT    . CESAREAN SECTION     2 c-sections  . CHOLECYSTECTOMY    . COLONOSCOPY      . KNEE SURGERY     Bil  . NECK SURGERY    . TONSILLECTOMY      Social History:  Ambulatory   independently       reports that she has never smoked. She has never used smokeless tobacco. She reports that she does not drink alcohol or use drugs.   Family History:   Family History  Problem Relation Age of Onset  . Colon cancer Cousin   . Cancer Father   . Diabetes Father   . Squamous cell carcinoma Father   . Diabetes Mother   . Diabetes Maternal Grandfather   . Diabetes Paternal Grandmother   . Diabetes Brother     Allergies: Allergies  Allergen Reactions  . Canagliflozin Diarrhea  . Dapagliflozin Nausea And Vomiting    UTI, yeast  . Dulaglutide Diarrhea  . Empagliflozin Diarrhea  . Metformin Diarrhea     Prior to Admission medications   Medication Sig Start Date End Date Taking? Authorizing Provider  Ascorbic Acid (VITAMIN C) 1000 MG tablet Take 1,000 mg by mouth daily.    [provider]  aspirin EC 81 MG tablet Take 81 mg by mouth daily.    [provider]  azithromycin (ZITHROMAX) 250 MG tablet Take by mouth daily.    [provider]  B Complex-C (SUPER B COMPLEX PO) Take 1 tablet by mouth daily.    [provider]  Brinzolamide-Brimonidine [facility]) 1-0.2 % SUSP Apply to eye 3 (three) times daily. Apply drops in both eye tid    [provider]  Cholecalciferol (VITAMIN D)  2000 UNITS CAPS Take 1 capsule by mouth daily. Vit d 1000 units daily    [provider]  Cholestyramine POWD Take 4 g by mouth daily. 01/22/18   Milus Banister, MD  Continuous Blood Gluc Sensor (FREESTYLE LIBRE 14 DAY SENSOR) MISC by Does not apply route.    [provider]  Dulaglutide (TRULICITY) 1.5 KD/3.2IZ SOPN Inject 1.5 mg into the skin once a week. Inject into skin every Friday. 12/27/15   [provider]  Empagliflozin (JARDIANCE) 10 MG TABS Take 1 tablet by mouth daily.    [provider]   escitalopram (LEXAPRO) 10 MG tablet Take 20 mg by mouth daily.  11/02/15 01/31/16  [provider]  fish oil-omega-3 fatty acids 1000 MG capsule Take 1 g by mouth 2 (two) times daily.    [provider]  glipiZIDE (GLUCOTROL XL) 10 MG 24 hr tablet Take 10 mg by mouth daily with breakfast.    [provider]  hydrochlorothiazide (HYDRODIURIL) 25 MG tablet Take 25 mg by mouth daily. For fluid    [provider]  HYDROcodone-homatropine (HYCODAN) 5-1.5 MG/5ML syrup Take 5 mLs by mouth every 6 (six) hours as needed for cough.    [provider]  ibuprofen (ADVIL,MOTRIN) 600 MG tablet Take 1 tablet (600 mg total) by mouth every 6 (six) hours as needed. Patient not taking: Reported on 04/03/2018 05/24/14   Horton, Barbette Hair, MD  Insulin Human (INSULIN PUMP) SOLN Inject into the skin. Insulin Pump- regulated by Dr Peri Jefferson in [place]    [provider]  methylPREDNISolone (MEDROL DOSEPAK) 4 MG TBPK tablet Use as directed on the package Patient not taking: Reported on 04/03/2018 11/27/17   Fatima Blank, MD  MILK THISTLE PO Take 250 mg by mouth 2 (two) times daily.     [provider]  Multiple Vitamins-Minerals (MULTIVITAMIN WOMEN 50+ PO) Take by mouth daily.    [provider]  omeprazole (PRILOSEC) 40 MG capsule Take 40 mg by mouth daily.    [provider]  oxyCODONE-acetaminophen (PERCOCET/ROXICET) 5-325 MG tablet Take 2 tablets by mouth every 4 (four) hours as needed for severe pain. Patient not taking: Reported on 04/03/2018 05/29/15   Fransico Meadow, PA-C  polyethylene glycol-electrolytes (NULYTELY/GOLYTELY) 420 g solution Take 4,000 mLs by mouth as directed. 01/28/18   Milus Banister, MD  pravastatin (PRAVACHOL) 40 MG tablet Take 40 mg by mouth. 11/02/15   [provider]   Physical Exam: Blood pressure (!) 178/62, pulse (!) 132, temperature (!) 103.9 F (39.9 C), temperature source Rectal, resp.  rate (!) 23, height '5\' 4"'$  (1.626 m), weight 108.9 kg, SpO2 95 %. 1. General:  in No  Acute distress    Chronically ill  acutely ill -appearing 2. Psychological: Alert and  Oriented 3. Head/ENT:    Dry Mucous Membranes                          Head Non traumatic, neck supple                           Poor Dentition 4. SKIN:    decreased Skin turgor,  Skin clean Dry and intact no rash 5. Heart:  Rapid Regular rate and rhythm no  Murmur, no Rub or gallop 6. Lungs:  Clear to auscultation bilaterally, no wheezes or crackles   7. Abdomen: Soft, non-tender, distended  obese hyperactive bowel sounds present 8. Lower extremities: no clubbing, cyanosis,   trace edema 9. Neurologically Grossly intact, moving all 4 extremities equally   10. MSK: Normal range of motion   LABS:     Recent Labs  Lab 10/04/18 2219  WBC 5.0  NEUTROABS 3.9  HGB 13.5  HCT 43.0  MCV 96.6  PLT 027   Basic Metabolic Panel: Recent Labs  Lab 10/04/18 2219  NA 138  K 3.6  CL 101  CO2 25  GLUCOSE 104*  BUN 15  CREATININE 1.19*  CALCIUM 8.6*      Recent Labs  Lab 10/04/18 2219  AST 63*  ALT 52*  ALKPHOS 109  BILITOT 1.0  PROT 7.1  ALBUMIN 3.7   Recent Labs  Lab 10/04/18 2219  LIPASE 30   No results for input(s): AMMONIA in the last 168 hours.    HbA1C: No results for input(s): HGBA1C in the last 72 hours. CBG: Recent Labs  Lab 10/04/18 2152 10/04/18 2211 10/04/18 2254  GLUCAP 52* 63* 188*      Urine analysis:    Component Value Date/Time   COLORURINE YELLOW 01/19/2016 2004   APPEARANCEUR CLEAR 01/19/2016 2004   LABSPEC 1.019 01/19/2016 2004   PHURINE 6.0 01/19/2016 2004   GLUCOSEU >1000 (A) 01/19/2016 2004   HGBUR NEGATIVE 01/19/2016 2004   BILIRUBINUR NEGATIVE 01/19/2016 2004   [place] NEGATIVE 01/19/2016 2004   PROTEINUR NEGATIVE 01/19/2016 2004   NITRITE NEGATIVE 01/19/2016 2004   LEUKOCYTESUR NEGATIVE 01/19/2016 2004       Cultures: No results found for: SDES,  SPECREQUEST, CULT, REPTSTATUS   Radiological Exams on Admission: Dg Chest Port 1 View  Result Date: 10/04/2018 CLINICAL DATA:  68 year old female with shortness of breath. EXAM: PORTABLE CHEST 1 VIEW COMPARISON:  Chest radiograph dated 11/27/2017 FINDINGS: Shallow inspiration. No focal consolidation, pleural effusion, or pneumothorax. Stable borderline cardiomegaly. No acute osseous pathology. Lower cervical fixation plate. IMPRESSION: No active disease. Electronically Signed   By: Anner Crete M.D.   On: 10/04/2018 23:17    Chart has been reviewed    Assessment/Plan   68 y.o. female with medical history significant of DM 2 on a T-Slim insulin pump.  complicated by Retinopathy, neuropathy and HLD  Admitted for acute pyelonephritis and sepsis with brittle diabetes complicated by hypoglycemia  Present on Admission: Pyelonephritis likely cause of sepsis -  - treat with Rocephin        await results of urine culture and adjust antibiotic coverage as needed  Given severity of symptoms and persistent back pain will obtain CT to evaluate for perinephric abscess  . Sepsis ([place]) -  -SIRS criteria met with  tachycardia ,   fever.    without evidence of end organ damage such as   -Most likely source being: urinary, vs intra-abdominal   - Obtain serial lactic acid and procalcitonin level.  - Initiate IV antibiotics   - await results of blood and urine culture  - Rehydrate aggressively  . Chronic diarrhea - check gastric panel pt needs to follow up with Gi  . DM (diabetes mellitus), type 2 with complications (HCC) -brittle, difficult to control- Order Sensitive  SSI   -And significant hypoglycemia we will hold insulin for now discontinued insulin pump for tonight once blood sugars are stable can resume home dose insulin at a lower dose will need to follow-up with endocrinologist continue to monitor carefully blood sugars -  check TSH and HgA1C  - Hold by mouth medications    .  Hypoglycemia  due to insulin -setting of decreased p.o. intake required D50 to control currently blood sugar stable. Will hold off of insulin pump and Levemir for tonight continue to monitor blood sugar.  When no more evidence of hypoglycemia and blood sugars stable repeatedly would resume Levemir at a lower dose  . Dehydration-  rehydrate and follow fluid status   Other plan as per orders.  DVT prophylaxis:    Lovenox     Code Status:  FULL CODE  as per patient  I had personally discussed CODE STATUS with patient and family    Family Communication:   Family  at  Bedside  plan of care was discussed with   Daughter,  Disposition Plan:     To home once workup is complete and patient is stable                     Would benefit from PT/OT eval prior to [place] called: none    Admission status:  Obs    Level of care     SDU tele indefinitely please discontinue once patient no longer qualifies      Katianna Mcclenney 10/05/2018, 1:08 AM    [facility]     after 2 AM please page floor coverage PA If 7AM-7PM, please contact the day team taking care of the patient using [email or web address]

## 2018-10-04 NOTE — ED Notes (Signed)
XR at bedside

## 2018-10-04 NOTE — ED Triage Notes (Signed)
While in [facility] the patient noticed her CBG was low. She states sh was unable to use her insulin pump due to malfunction. Last CBG while at home was 79.

## 2018-10-04 NOTE — ED Provider Notes (Signed)
[facility] DEPT Provider Note   CSN: [id number] Arrival date & time: 10/04/18  2149    History   Chief Complaint Chief Complaint  Patient presents with  . Hypoglycemia    HPI Ellen Mccoy is a 68 y.o. female.     68 year old female presents with emesis which began yesterday.  No abdominal discomfort but has had problems controlling her blood sugar.  Does have an insulin pump and turned it off.  Blood sugar has been in the 50s.  Has attempted to medicate without success.  Slight cough or congestion with some chills but no documented fever.  Patient also currently being treated for a UTI that was diagnosed clinically and she has been taking Macrodantin for that.  Endorses some flank pain at this time.     Past Medical History:  Diagnosis Date  . Blood transfusion without reported diagnosis 1978   after c section  . Cataract   . Depression   . Diabetes mellitus   . Diarrhea   . GERD (gastroesophageal reflux disease)   . Glaucoma   . Hepatitis B    after blood transfusion  . Hyperlipidemia   . Hypertension   . Obesity     Patient Active Problem List   Diagnosis Date Noted  . Chronic diarrhea 04/30/2014  . Other dysphagia 04/30/2014    Past Surgical History:  Procedure Laterality Date  . ABDOMINAL HYSTERECTOMY    . ANKLE SURGERY    . APPENDECTOMY     not sure, may have been done with hysterectomy  . CATARACT EXTRACTION W/ INTRAOCULAR LENS IMPLANT    . CESAREAN SECTION     2 c-sections  . CHOLECYSTECTOMY    . COLONOSCOPY    . KNEE SURGERY     Bil  . NECK SURGERY    . TONSILLECTOMY       OB History   No obstetric history on file.      Home Medications    Prior to Admission medications   Medication Sig Start Date End Date Taking? Authorizing Provider  Ascorbic Acid (VITAMIN C) 1000 MG tablet Take 1,000 mg by mouth daily.    [provider]  aspirin EC 81 MG tablet Take 81 mg by mouth daily.    [provider]  azithromycin (ZITHROMAX) 250 MG tablet Take by mouth daily.    [provider]  B Complex-C (SUPER B COMPLEX PO) Take 1 tablet by mouth daily.    [provider]  Brinzolamide-Brimonidine [facility]) 1-0.2 % SUSP Apply to eye 3 (three) times daily. Apply drops in both eye tid    [provider]  Cholecalciferol (VITAMIN D) 2000 UNITS CAPS Take 1 capsule by mouth daily. Vit d 1000 units daily    [provider]  Cholestyramine POWD Take 4 g by mouth daily. 01/22/18   Milus Banister, MD  Continuous Blood Gluc Sensor (FREESTYLE LIBRE 14 DAY SENSOR) MISC by Does not apply route.    [provider]  Dulaglutide (TRULICITY) 1.5 RS/8.5IO SOPN Inject 1.5 mg into the skin once a week. Inject into skin every Friday. 12/27/15   [provider]  Empagliflozin (JARDIANCE) 10 MG TABS Take 1 tablet by mouth daily.    [provider]  escitalopram (LEXAPRO) 10 MG tablet Take 20 mg by mouth daily.  11/02/15 01/31/16  [provider]  fish oil-omega-3 fatty acids 1000 MG capsule Take 1 g by mouth 2 (two) times daily.  [provider]  glipiZIDE (GLUCOTROL XL) 10 MG 24 hr tablet Take 10 mg by mouth daily with breakfast.    [provider]  hydrochlorothiazide (HYDRODIURIL) 25 MG tablet Take 25 mg by mouth daily. For fluid    [provider]  HYDROcodone-homatropine (HYCODAN) 5-1.5 MG/5ML syrup Take 5 mLs by mouth every 6 (six) hours as needed for cough.    [provider]  ibuprofen (ADVIL,MOTRIN) 600 MG tablet Take 1 tablet (600 mg total) by mouth every 6 (six) hours as needed. Patient not taking: Reported on 04/03/2018 05/24/14   Horton, Barbette Hair, MD  Insulin Human (INSULIN PUMP) SOLN Inject into the skin. Insulin Pump- regulated by Dr Peri Jefferson in [place]    [provider]  methylPREDNISolone (MEDROL DOSEPAK) 4 MG TBPK tablet Use as directed on the package Patient not taking:  Reported on 04/03/2018 11/27/17   Fatima Blank, MD  MILK THISTLE PO Take 250 mg by mouth 2 (two) times daily.     [provider]  Multiple Vitamins-Minerals (MULTIVITAMIN WOMEN 50+ PO) Take by mouth daily.    [provider]  omeprazole (PRILOSEC) 40 MG capsule Take 40 mg by mouth daily.    [provider]  oxyCODONE-acetaminophen (PERCOCET/ROXICET) 5-325 MG tablet Take 2 tablets by mouth every 4 (four) hours as needed for severe pain. Patient not taking: Reported on 04/03/2018 05/29/15   Fransico Meadow, PA-C  polyethylene glycol-electrolytes (NULYTELY/GOLYTELY) 420 g solution Take 4,000 mLs by mouth as directed. 01/28/18   Milus Banister, MD  pravastatin (PRAVACHOL) 40 MG tablet Take 40 mg by mouth. 11/02/15   [provider]    Family History Family History  Problem Relation Age of Onset  . Colon cancer Cousin   . Cancer Father   . Diabetes Father   . Squamous cell carcinoma Father   . Diabetes Mother   . Diabetes Maternal Grandfather   . Diabetes Paternal Grandmother   . Diabetes Brother     Social History Social History   Tobacco Use  . Smoking status: Never Smoker  . Smokeless tobacco: Never Used  Substance Use Topics  . Alcohol use: No  . Drug use: No     Allergies   Canagliflozin; Dapagliflozin; Dulaglutide; Empagliflozin; and Metformin   Review of Systems Review of Systems  All other systems reviewed and are negative.    Physical Exam Updated Vital Signs Ht 1.626 m (5\' 4" )   Wt 108.9 kg   BMI 41.20 kg/m   Physical Exam Vitals signs and nursing note reviewed.  Constitutional:      General: She is not in acute distress.    Appearance: Normal appearance. She is well-developed. She is not toxic-appearing.  HENT:     Head: Normocephalic and atraumatic.  Eyes:     General: Lids are normal.     Conjunctiva/sclera: Conjunctivae normal.     Pupils: Pupils are equal, round, and reactive to light.  Neck:      Musculoskeletal: Normal range of motion and neck supple.     Thyroid: No thyroid mass.     Trachea: No tracheal deviation.  Cardiovascular:     Rate and Rhythm: Normal rate and regular rhythm.     Heart sounds: Normal heart sounds. No murmur. No gallop.   Pulmonary:     Effort: Pulmonary effort is normal. No respiratory distress.     Breath sounds: Normal breath sounds. No stridor. No decreased breath sounds, wheezing, rhonchi or rales.  Abdominal:     General: Bowel sounds are normal. There is no distension.     Palpations: Abdomen is soft.     Tenderness: There is no abdominal tenderness. There is no rebound.  Musculoskeletal: Normal range of motion.        General: No tenderness.  Skin:    General: Skin is warm and dry.     Findings: No abrasion or rash.  Neurological:     Mental Status: She is alert and oriented to person, place, and time.     GCS: GCS eye subscore is 4. GCS verbal subscore is 5. GCS motor subscore is 6.     Cranial Nerves: No cranial nerve deficit.     Sensory: No sensory deficit.  Psychiatric:        Speech: Speech normal.        Behavior: Behavior normal.      ED Treatments / Results  Labs (all labs ordered are listed, but only abnormal results are displayed) Labs Reviewed  CBG MONITORING, ED - Abnormal; Notable for the following components:      Result Value   Glucose-Capillary 52 (*)    All other components within normal limits  CBG MONITORING, ED - Abnormal; Notable for the following components:   Glucose-Capillary 63 (*)    All other components within normal limits  URINE CULTURE  CBC WITH DIFFERENTIAL/PLATELET  COMPREHENSIVE METABOLIC PANEL  LIPASE, BLOOD  URINALYSIS, ROUTINE W REFLEX MICROSCOPIC  INFLUENZA PANEL BY PCR (TYPE A & B)    EKG None  Radiology No results found.  Procedures Procedures (including critical care time)  Medications Ordered in ED Medications  dextrose (GLUTOSE) 40 % oral gel 37.5 g (37.5 g Oral Given  10/04/18 2158)  sodium chloride 0.9 % bolus 1,000 mL (has no administration in time range)  0.9 %  sodium chloride infusion (has no administration in time range)  dextrose 50 % solution 50 mL (has no administration in time range)  metoCLOPramide (REGLAN) injection 5 mg (has no administration in time range)     Initial Impression / Assessment and Plan / ED Course  I have reviewed the triage vital signs and the nursing notes.  Pertinent labs & imaging results that were available during my care of the patient were reviewed by me and considered in my medical decision making (see chart for details).        Patient is febrile here with temperature of 103.9.  She was given Tylenol orally.  Also given IV hydration.  Will fluid bolus with 30 cc/kg.  Patient started on empiric antibiotics as well as had a flu swab performed.  Chest x-ray without acute findings.  Patient's blood sugar was low here and she was given 1 amp D50 and repeat blood sugar is 188.  She is mentating appropriate this time.  Lactate is pending.  Patient to be admitted to the hospitalist service  CRITICAL CARE Performed by: Leota Jacobsen Total critical care time: 60 minutes Critical care time was exclusive of separately billable procedures and treating other patients. Critical care was necessary to treat or prevent imminent or life-threatening deterioration. Critical care was time spent personally by me on the following activities: development of treatment plan with patient and/or surrogate as well as nursing, discussions with consultants, evaluation of patient's response to treatment, examination of patient, obtaining history from patient or surrogate, ordering and performing treatments and interventions, ordering and review of laboratory studies, ordering and review of radiographic studies, pulse oximetry  and re-evaluation of patient's condition.   Final Clinical Impressions(s) / ED Diagnoses   Final diagnoses:  None     ED Discharge Orders    None       Lacretia Leigh, MD 10/04/18 2330

## 2018-10-04 NOTE — Progress Notes (Signed)
A consult was received from an ED physician for Vancomycin, cefepime per pharmacy dosing.  The patient's profile has been reviewed for ht/wt/allergies/indication/available labs.   A one time order has been placed for Vancomycin 2gm iv x1, and Cefepime 2gm iv x1.  Further antibiotics/pharmacy consults should be ordered by admitting physician if indicated.                       Thank you, Nani Skillern Crowford 10/04/2018  11:08 PM

## 2018-10-05 ENCOUNTER — Encounter (HOSPITAL_COMMUNITY): Payer: Self-pay

## 2018-10-05 ENCOUNTER — Observation Stay (HOSPITAL_COMMUNITY): Payer: Medicare Other

## 2018-10-05 DIAGNOSIS — E11649 Type 2 diabetes mellitus with hypoglycemia without coma: Secondary | ICD-10-CM | POA: Diagnosis present

## 2018-10-05 DIAGNOSIS — E785 Hyperlipidemia, unspecified: Secondary | ICD-10-CM | POA: Diagnosis present

## 2018-10-05 DIAGNOSIS — Z7982 Long term (current) use of aspirin: Secondary | ICD-10-CM | POA: Diagnosis not present

## 2018-10-05 DIAGNOSIS — N3000 Acute cystitis without hematuria: Secondary | ICD-10-CM | POA: Diagnosis present

## 2018-10-05 DIAGNOSIS — Z833 Family history of diabetes mellitus: Secondary | ICD-10-CM | POA: Diagnosis not present

## 2018-10-05 DIAGNOSIS — Z9641 Presence of insulin pump (external) (internal): Secondary | ICD-10-CM | POA: Diagnosis present

## 2018-10-05 DIAGNOSIS — E860 Dehydration: Secondary | ICD-10-CM | POA: Diagnosis present

## 2018-10-05 DIAGNOSIS — E1140 Type 2 diabetes mellitus with diabetic neuropathy, unspecified: Secondary | ICD-10-CM | POA: Diagnosis present

## 2018-10-05 DIAGNOSIS — I10 Essential (primary) hypertension: Secondary | ICD-10-CM | POA: Diagnosis present

## 2018-10-05 DIAGNOSIS — E160 Drug-induced hypoglycemia without coma: Secondary | ICD-10-CM | POA: Diagnosis present

## 2018-10-05 DIAGNOSIS — K529 Noninfective gastroenteritis and colitis, unspecified: Secondary | ICD-10-CM | POA: Diagnosis present

## 2018-10-05 DIAGNOSIS — E11319 Type 2 diabetes mellitus with unspecified diabetic retinopathy without macular edema: Secondary | ICD-10-CM | POA: Diagnosis present

## 2018-10-05 DIAGNOSIS — N10 Acute tubulo-interstitial nephritis: Secondary | ICD-10-CM | POA: Diagnosis present

## 2018-10-05 DIAGNOSIS — A4151 Sepsis due to Escherichia coli [E. coli]: Secondary | ICD-10-CM | POA: Diagnosis present

## 2018-10-05 DIAGNOSIS — E118 Type 2 diabetes mellitus with unspecified complications: Secondary | ICD-10-CM | POA: Diagnosis not present

## 2018-10-05 DIAGNOSIS — A419 Sepsis, unspecified organism: Secondary | ICD-10-CM | POA: Diagnosis present

## 2018-10-05 DIAGNOSIS — T383X5A Adverse effect of insulin and oral hypoglycemic [antidiabetic] drugs, initial encounter: Secondary | ICD-10-CM

## 2018-10-05 LAB — BLOOD CULTURE ID PANEL (REFLEXED)
Acinetobacter baumannii: NOT DETECTED
CANDIDA TROPICALIS: NOT DETECTED
Candida albicans: NOT DETECTED
Candida glabrata: NOT DETECTED
Candida krusei: NOT DETECTED
Candida parapsilosis: NOT DETECTED
Carbapenem resistance: NOT DETECTED
Enterobacter cloacae complex: NOT DETECTED
Enterobacteriaceae species: DETECTED — AB
Enterococcus species: NOT DETECTED
Escherichia coli: DETECTED — AB
Haemophilus influenzae: NOT DETECTED
Klebsiella oxytoca: NOT DETECTED
Klebsiella pneumoniae: NOT DETECTED
Listeria monocytogenes: NOT DETECTED
Neisseria meningitidis: NOT DETECTED
Proteus species: NOT DETECTED
Pseudomonas aeruginosa: NOT DETECTED
Serratia marcescens: NOT DETECTED
Staphylococcus aureus (BCID): NOT DETECTED
Staphylococcus species: NOT DETECTED
Streptococcus agalactiae: NOT DETECTED
Streptococcus pneumoniae: NOT DETECTED
Streptococcus pyogenes: NOT DETECTED
Streptococcus species: NOT DETECTED

## 2018-10-05 LAB — GLUCOSE, CAPILLARY
GLUCOSE-CAPILLARY: 430 mg/dL — AB (ref 70–99)
Glucose-Capillary: 272 mg/dL — ABNORMAL HIGH (ref 70–99)
Glucose-Capillary: 320 mg/dL — ABNORMAL HIGH (ref 70–99)
Glucose-Capillary: 299 mg/dL — ABNORMAL HIGH (ref 70–99)

## 2018-10-05 LAB — COMPREHENSIVE METABOLIC PANEL
ALT: 45 U/L — ABNORMAL HIGH (ref 0–44)
ANION GAP: 9 (ref 5–15)
AST: 55 U/L — ABNORMAL HIGH (ref 15–41)
Albumin: 2.8 g/dL — ABNORMAL LOW (ref 3.5–5.0)
Alkaline Phosphatase: 82 U/L (ref 38–126)
BUN: 15 mg/dL (ref 8–23)
CALCIUM: 7.6 mg/dL — AB (ref 8.9–10.3)
CO2: 21 mmol/L — ABNORMAL LOW (ref 22–32)
Chloride: 108 mmol/L (ref 98–111)
Creatinine, Ser: 1.11 mg/dL — ABNORMAL HIGH (ref 0.44–1.00)
GFR calc Af Amer: 59 mL/min — ABNORMAL LOW (ref >60)
GFR calc non Af Amer: 51 mL/min — ABNORMAL LOW (ref >60)
Glucose, Bld: 170 mg/dL — ABNORMAL HIGH (ref 70–99)
Potassium: 4 mmol/L (ref 3.5–5.1)
Sodium: 138 mmol/L (ref 135–145)
TOTAL PROTEIN: 5.6 g/dL — AB (ref 6.5–8.1)
Total Bilirubin: 1 mg/dL (ref 0.3–1.2)

## 2018-10-05 LAB — CBC
HCT: 38 % (ref 36.0–46.0)
HEMOGLOBIN: 11.4 g/dL — AB (ref 12.0–15.0)
MCH: 29.6 pg (ref 26.0–34.0)
MCHC: 30 g/dL (ref 30.0–36.0)
MCV: 98.7 fL (ref 80.0–100.0)
Platelets: 174 10*3/uL (ref 150–400)
RBC: 3.85 MIL/uL — AB (ref 3.87–5.11)
RDW: 13.6 % (ref 11.5–15.5)
WBC: 16.1 10*3/uL — ABNORMAL HIGH (ref 4.0–10.5)
nRBC: 0 % (ref 0.0–0.2)

## 2018-10-05 LAB — HEMOGLOBIN A1C
HEMOGLOBIN A1C: 8.5 % — AB (ref 4.8–5.6)
Hgb A1c MFr Bld: 8.5 % — ABNORMAL HIGH (ref 4.8–5.6)
Mean Plasma Glucose: 197.25 mg/dL
Mean Plasma Glucose: 197.25 mg/dL

## 2018-10-05 LAB — CBG MONITORING, ED
Glucose-Capillary: 113 mg/dL — ABNORMAL HIGH (ref 70–99)
Glucose-Capillary: 145 mg/dL — ABNORMAL HIGH (ref 70–99)
Glucose-Capillary: 157 mg/dL — ABNORMAL HIGH (ref 70–99)
Glucose-Capillary: 178 mg/dL — ABNORMAL HIGH (ref 70–99)

## 2018-10-05 LAB — TSH: TSH: 1.884 u[IU]/mL (ref 0.350–4.500)

## 2018-10-05 LAB — PHOSPHORUS: Phosphorus: 3.2 mg/dL (ref 2.5–4.6)

## 2018-10-05 LAB — LACTIC ACID, PLASMA
Lactic Acid, Venous: 1.5 mmol/L (ref 0.5–1.9)
Lactic Acid, Venous: 0.9 mmol/L (ref 0.5–1.9)

## 2018-10-05 LAB — HIV ANTIBODY (ROUTINE TESTING W REFLEX): HIV Screen 4th Generation wRfx: NONREACTIVE

## 2018-10-05 LAB — PROCALCITONIN: Procalcitonin: 39.79 ng/mL

## 2018-10-05 LAB — MAGNESIUM: Magnesium: 1.6 mg/dL — ABNORMAL LOW (ref 1.7–2.4)

## 2018-10-05 MED ORDER — ASPIRIN EC 81 MG PO TBEC
81.0000 mg | DELAYED_RELEASE_TABLET | Freq: Every day | ORAL | Status: DC
  Administered 2018-10-05 – 2018-10-07 (×3): 81 mg via ORAL
  Filled 2018-10-05 (×3): qty 1

## 2018-10-05 MED ORDER — INSULIN GLARGINE 100 UNIT/ML ~~LOC~~ SOLN
30.0000 [IU] | SUBCUTANEOUS | Status: DC
  Administered 2018-10-05: 30 [IU] via SUBCUTANEOUS
  Filled 2018-10-05: qty 0.3

## 2018-10-05 MED ORDER — PANTOPRAZOLE SODIUM 40 MG PO TBEC
40.0000 mg | DELAYED_RELEASE_TABLET | Freq: Every day | ORAL | Status: DC
  Administered 2018-10-05 – 2018-10-07 (×3): 40 mg via ORAL
  Filled 2018-10-05 (×3): qty 1

## 2018-10-05 MED ORDER — ACETAMINOPHEN 325 MG PO TABS
650.0000 mg | ORAL_TABLET | Freq: Four times a day (QID) | ORAL | Status: DC | PRN
  Administered 2018-10-05 – 2018-10-07 (×4): 650 mg via ORAL
  Filled 2018-10-05 (×4): qty 2

## 2018-10-05 MED ORDER — INSULIN PUMP
Freq: Three times a day (TID) | SUBCUTANEOUS | Status: DC
  Administered 2018-10-05: 21 via SUBCUTANEOUS
  Administered 2018-10-06: 15.5 via SUBCUTANEOUS
  Administered 2018-10-06: 25 via SUBCUTANEOUS
  Administered 2018-10-07: 08:00:00 via SUBCUTANEOUS

## 2018-10-05 MED ORDER — GUAIFENESIN-DM 100-10 MG/5ML PO SYRP
5.0000 mL | ORAL_SOLUTION | ORAL | Status: DC | PRN
  Administered 2018-10-05 – 2018-10-06 (×2): 5 mL via ORAL
  Filled 2018-10-05 (×2): qty 10

## 2018-10-05 MED ORDER — SODIUM CHLORIDE (PF) 0.9 % IJ SOLN
INTRAMUSCULAR | Status: AC
  Filled 2018-10-05: qty 50

## 2018-10-05 MED ORDER — INSULIN DETEMIR 100 UNIT/ML ~~LOC~~ SOLN
24.0000 [IU] | Freq: Every day | SUBCUTANEOUS | Status: DC
  Filled 2018-10-05: qty 0.24

## 2018-10-05 MED ORDER — POTASSIUM CHLORIDE 10 MEQ/100ML IV SOLN
10.0000 meq | INTRAVENOUS | Status: AC
  Administered 2018-10-05 (×2): 10 meq via INTRAVENOUS
  Filled 2018-10-05 (×2): qty 100

## 2018-10-05 MED ORDER — IOPAMIDOL (ISOVUE-300) INJECTION 61%
INTRAVENOUS | Status: AC
  Filled 2018-10-05: qty 100

## 2018-10-05 MED ORDER — INSULIN ASPART 100 UNIT/ML ~~LOC~~ SOLN
0.0000 [IU] | Freq: Three times a day (TID) | SUBCUTANEOUS | Status: DC
  Administered 2018-10-05: 5 [IU] via SUBCUTANEOUS

## 2018-10-05 MED ORDER — SODIUM CHLORIDE 0.9 % IV SOLN
INTRAVENOUS | Status: AC
  Administered 2018-10-05: 01:00:00 via INTRAVENOUS

## 2018-10-05 MED ORDER — HYDROMORPHONE HCL 1 MG/ML IJ SOLN
0.5000 mg | INTRAMUSCULAR | Status: DC | PRN
  Administered 2018-10-05: 0.5 mg via INTRAVENOUS
  Filled 2018-10-05: qty 1

## 2018-10-05 MED ORDER — SODIUM CHLORIDE 0.9 % IV SOLN
2.0000 g | INTRAVENOUS | Status: DC
  Administered 2018-10-05 – 2018-10-06 (×2): 2 g via INTRAVENOUS
  Filled 2018-10-05: qty 20
  Filled 2018-10-05: qty 2

## 2018-10-05 MED ORDER — ENOXAPARIN SODIUM 40 MG/0.4ML ~~LOC~~ SOLN
40.0000 mg | SUBCUTANEOUS | Status: DC
  Administered 2018-10-05 – 2018-10-06 (×2): 40 mg via SUBCUTANEOUS
  Filled 2018-10-05 (×3): qty 0.4

## 2018-10-05 MED ORDER — IOPAMIDOL (ISOVUE-300) INJECTION 61%
100.0000 mL | Freq: Once | INTRAVENOUS | Status: AC | PRN
  Administered 2018-10-05: 100 mL via INTRAVENOUS

## 2018-10-05 MED ORDER — ACETAMINOPHEN 650 MG RE SUPP: 650.0000 mg | Freq: Four times a day (QID) | RECTAL | Status: DC | PRN

## 2018-10-05 MED ORDER — ONDANSETRON HCL 4 MG/2ML IJ SOLN: 4.0000 mg | Freq: Four times a day (QID) | INTRAMUSCULAR | Status: DC | PRN

## 2018-10-05 MED ORDER — ONDANSETRON HCL 4 MG PO TABS: 4.0000 mg | ORAL_TABLET | Freq: Four times a day (QID) | ORAL | Status: DC | PRN

## 2018-10-05 MED ORDER — INSULIN DETEMIR 100 UNIT/ML ~~LOC~~ SOLN
30.0000 [IU] | Freq: Every day | SUBCUTANEOUS | Status: DC
  Administered 2018-10-06 – 2018-10-07 (×2): 30 [IU] via SUBCUTANEOUS
  Filled 2018-10-05 (×2): qty 0.3

## 2018-10-05 MED ORDER — SODIUM CHLORIDE 0.9 % IV SOLN: 1.0000 g | INTRAVENOUS | Status: DC

## 2018-10-05 MED ORDER — ALBUTEROL SULFATE (2.5 MG/3ML) 0.083% IN NEBU: 2.5000 mg | INHALATION_SOLUTION | RESPIRATORY_TRACT | Status: DC | PRN

## 2018-10-05 MED ORDER — PRAVASTATIN SODIUM 20 MG PO TABS
40.0000 mg | ORAL_TABLET | Freq: Every day | ORAL | Status: DC
  Administered 2018-10-05 – 2018-10-07 (×3): 40 mg via ORAL
  Filled 2018-10-05 (×4): qty 2

## 2018-10-05 MED ORDER — INSULIN ASPART 100 UNIT/ML ~~LOC~~ SOLN: 0.0000 [IU] | Freq: Every day | SUBCUTANEOUS | Status: DC

## 2018-10-05 MED ORDER — INSULIN ASPART 100 UNIT/ML ~~LOC~~ SOLN
0.0000 [IU] | SUBCUTANEOUS | Status: DC
  Administered 2018-10-05: 2 [IU] via SUBCUTANEOUS
  Filled 2018-10-05: qty 1

## 2018-10-05 MED ORDER — INSULIN ASPART 100 UNIT/ML ~~LOC~~ SOLN
0.0000 [IU] | SUBCUTANEOUS | Status: DC
  Administered 2018-10-05: 12 [IU] via SUBCUTANEOUS
  Administered 2018-10-05: 15 [IU] via SUBCUTANEOUS

## 2018-10-05 NOTE — Progress Notes (Signed)
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  Pt with hx of DM related retinopathy and neuropathy   Clinical Impression   No OT needs identified - pt at baseline for ADLs. OT will sign off.    Follow Up Recommendations  No OT follow up    Equipment Recommendations  Other (comment)(pt to look into purchasing reacher and sock aid)    Recommendations for Other Services PT consult     Precautions / Restrictions Precautions Precautions: Fall Restrictions Weight Bearing Restrictions: No      Mobility Bed Mobility Overal bed mobility: Modified Independent                Transfers Overall transfer level: Modified independent               General transfer comment: sit<>stand unassisted    Balance Overall balance assessment: Needs assistance Sitting-balance support: No upper extremity supported;Feet supported Sitting balance-Leahy Scale: Good     Standing balance support: No upper extremity supported Standing balance-Leahy Scale: Good                             ADL either performed or assessed with clinical judgement   ADL Overall ADL's : Modified independent                                       General ADL Comments: has some difficulty reaching feet though she can do it; discussed reacher and sock aid and informed patient on where to purchase     Vision         Perception     Praxis      Pertinent Vitals/Pain Pain Assessment: No/denies pain     Hand Dominance     Extremity/Trunk Assessment Upper Extremity Assessment Upper Extremity Assessment: Overall WFL for tasks assessed   Lower Extremity Assessment Lower Extremity Assessment: Defer to PT evaluation       Communication  Communication Communication: No difficulties   Cognition Arousal/Alertness: Awake/alert Behavior During Therapy: WFL for tasks assessed/performed Overall Cognitive Status: Within Functional Limits for tasks assessed                                     General Comments  edema BLEs    Exercises     Shoulder Instructions      Home Living Family/patient expects to be discharged to:: Private residence Living Arrangements: Children Available Help at Discharge: Family;Available 24 hours/day Type of Home: House Home Access: Ramped entrance     Home Layout: One level     Bathroom Shower/Tub: Teacher, Fredy Gladu years/pre: Standard     Home Equipment: Cane - single point          Prior Functioning/Environment Level of Independence: Independent        Comments: retired, stays at home mostly        OT Problem List: Decreased strength;Cardiopulmonary status limiting activity      OT Treatment/Interventions:      OT Goals(Current goals can be found in the care plan section) Acute Rehab OT Goals Patient Stated Goal:  HOME OT Goal Formulation: All assessment and education complete, DC therapy  OT Frequency:     Barriers to D/C:            Co-evaluation   Reason for Co-Treatment: For patient/therapist safety PT goals addressed during session: Mobility/safety with mobility OT goals addressed during session: ADL's and self-care      AM-PAC OT "6 Clicks" Daily Activity     Outcome Measure Help from another person eating meals?: None Help from another person taking care of personal grooming?: None Help from another person toileting, which includes using toliet, bedpan, or urinal?: None Help from another person bathing (including washing, rinsing, drying)?: None Help from another person to put on and taking off regular upper body clothing?: None Help from another person to put on and taking off regular lower body clothing?: A Little 6 Click  Score: 23   End of Session Equipment Utilized During Treatment: Gait belt Nurse Communication: Mobility status  Activity Tolerance: Patient tolerated treatment well Patient left: in bed;with call bell/phone within reach  OT Visit Diagnosis: Muscle weakness (generalized) (M62.81)                Time: 2355-7322 OT Time Calculation (min): 13 min Charges:  OT General Charges $OT Visit: 1 Visit OT Evaluation $OT Eval Low Complexity: 1 Low    Roselia Snipe A Shayan Bramhall 10/05/2018, 10:22 GURKY:[id number]}

## 2018-10-05 NOTE — ED Notes (Signed)
Mortimer Fries (daughter) [phone] would liked to be called at anytime with updates for patient, and if she does not answer then leave a voicemail. Pt approved of this.

## 2018-10-05 NOTE — Progress Notes (Signed)
Inpatient Diabetes Program Recommendations  AACE/ADA: New Consensus Statement on Inpatient Glycemic Control Target Ranges:  Prepandial:   less than 140 mg/dL      Peak postprandial:   less than 180 mg/dL (1-2 hours)      Critically ill patients:  140 - 180 mg/dL  Results for Ellen Mccoy, Ellen Mccoy (MRN [id number]) as of 10/05/2018 13:37  Ref. Range 10/04/2018 21:52 10/04/2018 22:11 10/04/2018 22:54 10/05/2018 00:09 10/05/2018 03:00 10/05/2018 05:41 10/05/2018 07:53 10/05/2018 11:45  Glucose-Capillary Latest Ref Range: 70 - 99 mg/dL 52 (L) 63 (L) 188 (H) 157 (H) 113 (H) 145 (H) 178 (H) 272 (H)    Review of Glycemic Control  Diabetes history: DM2 Outpatient Diabetes medications: T-slim insulin pump with Novolog, Levemir 30 units QAM, Levemir 24 units QHS, Glipizide XL 10 mg BID Current orders for Inpatient glycemic control: Novolog 0-9 units TID with meals, Novolog 0-5 units QHS  Inpatient Diabetes Program Recommendations:  Insulin-Basal: Please consider ordering Lantus 30 units daily (based on 108 kg x 0.3 units).  Insulin-Correction: Please consider changing CBGs to Q4H and Novolog correction to 0-20 units Q4H for closer glucose monitoring and correction if needed.  NOTE: Noted consult for Diabetes Coordinator. Diabetes Coordinator is not on campus over the weekend but available by pager from 8am to 5pm for questions or concerns. Chart reviewed. Noted patient sees Radene Gunning, [place] with [facility] for DM management and patient was last seen on 08/12/18.  Per office note on 08/12/18 by Arlis Porta, PA the following should be patient's insulin pump settings:  Basal: 12A 1.0 units per hour 6A 2.3 units per hour 9A 4.75 units per hour 12P 4.75 units per hour 3P 6.0 units per hour 6P 4.25 units per hour 10P 3.75 units per hour  Insulin Carb Ratio 12A 1:3 (1 unit for every 3 grams of carbs) 9A 1:2.2 (1 unit for every 2.2 grams of carbs) 12P 1:1.8 (1 unit for every 1.8 grams of carbs) 6P 1:1.5 (1 unit for  every 1.5 grams of carbs) 10P 1:3.5 (1 unit for every 3.5 grams of carbs)  Also noted in office note on 08/12/18 by Arlis Porta, PA "Continue glipizide XL 10mg  BID. No pump adjustments at this time. Adjust Levemir to 30 units qAM and 24 units qPM."  Noted patient presented to the hospital with hypoglycemia which as resolved and glucose up to 272 mg/dl at 11:45 today. Recommend ordering Lantus insulin at this time and follow glucose trends Q4H. Will have Diabetes Coordinator follow up with patient on Monday 10/06/18.  Thanks, Barnie Alderman, RN, MSN, CDE Diabetes Coordinator Inpatient Diabetes Program [phone] (Team Pager from 8am to 5pm)

## 2018-10-05 NOTE — ED Notes (Signed)
Patient transported to CT 

## 2018-10-05 NOTE — Progress Notes (Signed)
Patient's daughter insisted that patient has chronic diarrhea, that she does not have Cdiff, and that she be taken off enteric precautions. MD aware, and directed that enteric precautions be removed. Kirkland Hun RN

## 2018-10-05 NOTE — Evaluation (Signed)
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  Pt with hx of DM related retinopathy and neuropathy  Clinical Impression  Pt admitted as above and presenting with functional mobility limitations 2* generalized weakness, mild ambulatory balance deficits and limited endurance.  This am, pt performing basic mobility tasks unassisted and ambulating at supervision level with mild general instability but no LOB - pt states this is ~ her base line.  With activity, pt maintained SaO2 at 95% or higher on RA but HR elevated above 120 bpm - RN aware.  Pt Hopeful for dc home this date.  Pt at baseline - PT service to sign off at this time.    Follow Up Recommendations No PT follow up    Equipment Recommendations  None recommended by PT    Recommendations for Other Services       Precautions / Restrictions Precautions Precautions: Fall Restrictions Weight Bearing Restrictions: No      Mobility  Bed Mobility Overal bed mobility: Modified Independent                Transfers Overall transfer level: Modified independent               General transfer comment: sit<>stand unassisted  Ambulation/Gait Ambulation/Gait assistance: Min guard;Supervision Gait Distance (Feet): 250 Feet Assistive device: None Gait Pattern/deviations: Step-through pattern;Decreased step length - right;Decreased step length - left;Shuffle;Wide base of support Gait velocity: mod pace   General Gait Details: mild general instability compensated with increased BOS; No LOB, min knee flexion with step through, SOB 2/4 with standing rest breaks required to complete task.  Stairs            Wheelchair Mobility    Modified Rankin (Stroke Patients Only)       Balance Overall balance assessment: Needs  assistance Sitting-balance support: No upper extremity supported;Feet supported Sitting balance-Leahy Scale: Good     Standing balance support: No upper extremity supported Standing balance-Leahy Scale: Good                               Pertinent Vitals/Pain Pain Assessment: No/denies pain    Home Living Family/patient expects to be discharged to:: Private residence Living Arrangements: Children Available Help at Discharge: Family;Available 24 hours/day Type of Home: House Home Access: Ramped entrance     Home Layout: One level Home Equipment: Cane - single point      Prior Function Level of Independence: Independent         Comments: retired, stays at home mostly     Hand Dominance        Extremity/Trunk Assessment   Upper Extremity Assessment Upper Extremity Assessment: Defer to OT evaluation    Lower Extremity Assessment Lower Extremity Assessment: Generalized weakness       Communication   Communication: No difficulties  Cognition Arousal/Alertness: Awake/alert Behavior During Therapy: WFL for tasks assessed/performed Overall Cognitive Status: Within Functional Limits for tasks assessed                                        General Comments      Exercises     Assessment/Plan    PT Assessment  Patent does not need any further PT services  PT Problem List         PT Treatment Interventions      PT Goals (Current goals can be found in the Care Plan section)  Acute Rehab PT Goals Patient Stated Goal: HOME PT Goal Formulation: All assessment and education complete, DC therapy    Frequency     Barriers to discharge        Co-evaluation PT/OT/SLP Co-Evaluation/Treatment: Yes Reason for Co-Treatment: For patient/therapist safety PT goals addressed during session: Mobility/safety with mobility OT goals addressed during session: ADL's and self-care       AM-PAC PT "6 Clicks" Mobility  Outcome Measure  Help needed turning from your back to your side while in a flat bed without using bedrails?: None Help needed moving from lying on your back to sitting on the side of a flat bed without using bedrails?: None Help needed moving to and from a bed to a chair (including a wheelchair)?: None Help needed standing up from a chair using your arms (e.g., wheelchair or bedside chair)?: None Help needed to walk in hospital room?: A Little Help needed climbing 3-5 steps with a railing? : A Little 6 Click Score: 22    End of Session Equipment Utilized During Treatment: Gait belt Activity Tolerance: Patient tolerated treatment well;Patient limited by fatigue Patient left: Other (comment)(EOB) Nurse Communication: Mobility status PT Visit Diagnosis: Unsteadiness on feet (R26.81);Difficulty in walking, not elsewhere classified (R26.2)    Time: 1324-4010 PT Time Calculation (min) (ACUTE ONLY): 13 min   Charges:   PT Evaluation $PT Eval Low Complexity: 1 Low          [place] Pager [phone] Office [phone]   Brucha Ahlquist 10/05/2018, 8:41 AM

## 2018-10-05 NOTE — Progress Notes (Addendum)
[facility]                                                                              Patient Demographics  Ellen Mccoy, is a 68 y.o. female, DOB - [birth year], YOV:[id number]  Admit date - 10/04/2018   Admitting Physician Toy Baker, MD  Outpatient Primary MD for the patient is Aletha Halim., PA-C  Outpatient specialists:   LOS - 0  days   Medical records reviewed and are as summarized below:    Chief Complaint  Patient presents with  . Hypoglycemia       Brief summary   Patient is a 68 year old female with diabetes mellitus type 2 on insulin pump, retinopathy, neuropathy, hyperlipidemia presented with nausea, vomiting, abdominal discomfort with back pain.  Patient also reported low blood sugars at home.  Patient reported symptoms of UTI with dysuria, back pain and increased frequency 3 days ago.  Patient was started on Macrobid however had only 1 dose on the day of admission.  While she was in [place], patient noticed her blood sugars were starting to run low and could not use insulin pump because of malfunctioning and turned it off. Patient was admitted for acute pyelonephritis  Assessment & Plan    Principal Problem:   Acute pyelonephritis, sepsis, acute cystitis -Patient met sepsis criteria at the time of admission with tachycardia, fever, leukocytosis, elevated procalcitonin UTI -Follow blood cultures, urine cultures, placed on IV Rocephin -CT abdomen consistent with acute cystitis  Active Problems:   Chronic diarrhea -Outpatient follow-up with GI, currently had soft solid stool in a.m. -Colonoscopy in 2014 was normal, (Dr. 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  Discussed the plan of care with patient's daughter at the bedside.  She was extremely upset about patient being on sliding scale insulin.  She requested that patient be put back on the insulin pump, Levemir and oral hypoglycemics.  Patient daughter reported that patient is very well versed with insulin pump and can manage on her own.  I have put the orders back for the insulin pump, diabetic coordinator consult has been placed already.  I did reiterate to the daughter that we will keep the patient only on insulin and she can restart oral hypoglycemics at home.  Patient's daughter also demanded the enteric precautions to be removed as she does not have any diarrhea.  Patient has chronic diarrhea issues and sees GI for the same.   The plan of care, imaging, results were all explained to the patient and her daughter. Time spent 41mns   Disposition Plan: Ad[facility]  Vital signs now stable, does not require stepdown unit  Time  Spent in minutes 35 minutes  Procedures:  CT abdomen  Consultants:   none  Antimicrobials:   Anti-infectives (From admission, onward)   Start     Dose/Rate Route Frequency Ordered Stop   10/05/18 2300  cefTRIAXone (ROCEPHIN) 1 g in sodium chloride 0.9 % 100 mL IVPB     1 g 200 mL/hr over 30 Minutes Intravenous Every 24 hours 10/05/18 0234     10/04/18 2315  vancomycin (VANCOCIN) 2,000 mg in sodium chloride 0.9 % 500 mL IVPB     2,000 mg 250 mL/hr over 120 Minutes Intravenous  Once 10/04/18 2308 10/05/18 0215   10/04/18 2300  ceFEPIme (MAXIPIME) 2 g in sodium chloride 0.9 % 100 mL IVPB     2 g 200 mL/hr over 30 Minutes Intravenous  Once 10/04/18 2250 10/04/18 2347   10/04/18 2300  metroNIDAZOLE  (FLAGYL) IVPB 500 mg     500 mg 100 mL/hr over 60 Minutes Intravenous Every 8 hours 10/04/18 2250     10/04/18 2300  vancomycin (VANCOCIN) IVPB 1000 mg/200 mL premix  Status:  Discontinued     1,000 mg 200 mL/hr over 60 Minutes Intravenous  Once 10/04/18 2250 10/04/18 2308          Medications  Scheduled Meds: . aspirin EC  81 mg Oral Daily  . enoxaparin (LOVENOX) injection  40 mg Subcutaneous Q24H  . insulin aspart  0-9 Units Subcutaneous Q4H  . pantoprazole  40 mg Oral Daily  . pravastatin  40 mg Oral Daily  . sodium chloride (PF)       Continuous Infusions: . sodium chloride 125 mL/hr at 10/05/18 0758  . sodium chloride 75 mL/hr at 10/05/18 0114  . cefTRIAXone (ROCEPHIN)  IV    . metronidazole 500 mg (10/05/18 0758)   PRN Meds:.acetaminophen **OR** acetaminophen, albuterol, dextrose, ondansetron **OR** ondansetron (ZOFRAN) IV      Subjective:   Ellen Mccoy was seen and examined today.  States still has back pain, dysuria improving.  No fevers.  No hematuria.  Had a soft solid BM in a.m.  Patient denies dizziness, chest pain, shortness of breath, abdominal pain, N/V, new weakness, numbess, tingling. No acute events overnight.    Objective:   Vitals:   10/05/18 0833 10/05/18 0900 10/05/18 0912 10/05/18 0930  BP: (!) 123/49  (!) 131/54 (!) 162/70  Pulse: 100 (!) 102 99 96  Resp: 13 20 (!) 22 17  Temp: 98 F (36.7 C)     TempSrc: Oral     SpO2: 94% 95% 93% 95%  Weight:      Height:        Intake/Output Summary (Last 24 hours) at 10/05/2018 1010 Last data filed at 10/05/2018 9767 Gross per 24 hour  Intake 16 ml  Output 425 ml  Net -409 ml     Wt Readings from Last 3 Encounters:  10/04/18 108.9 kg  04/03/18 109.2 kg  01/22/18 108.9 kg     Exam  General: Alert and oriented x 3, NAD  Eyes:  HEENT:  Atraumatic, normocephalic, normal oropharynx  Cardiovascular: S1 S2 auscultated, Regular rate and rhythm.  Respiratory: Clear to auscultation  bilaterally, no wheezing, rales or rhonchi  Gastrointestinal: Soft, nontender, nondistended, + bowel sounds, mild CVAT bilaterally  Ext: no pedal edema bilaterally  Neuro: No new deficits  Musculoskeletal: No digital cyanosis, clubbing  Skin: No rashes  Psych: Normal affect and demeanor, alert and oriented x3    Data Reviewed:  I have personally reviewed following  labs and imaging studies  Micro Results No results found for this or any previous visit (from the past 240 hour(s)).  Radiology Reports Ct Abdomen Pelvis W Contrast  Result Date: 10/05/2018 CLINICAL DATA:  Pyelonephritis. Possible sepsis. EXAM: CT ABDOMEN AND PELVIS WITH CONTRAST TECHNIQUE: Multidetector CT imaging of the abdomen and pelvis was performed using the standard protocol following bolus administration of intravenous contrast. CONTRAST:  149m ISOVUE-300 IOPAMIDOL (ISOVUE-300) INJECTION 61% COMPARISON:  None. FINDINGS: Lower chest: Mild dependent changes in the lung bases. Hepatobiliary: Diffuse fatty infiltration of the liver. No focal liver lesions. Gallbladder is surgically absent. No bile duct dilatation. Pancreas: Unremarkable. No pancreatic ductal dilatation or surrounding inflammatory changes. Spleen: Normal in size without focal abnormality. Adrenals/Urinary Tract: Kidneys are symmetrical in size and shape. Nephrograms are homogeneous and symmetrical. No hydronephrosis or hydroureter. Bladder wall is diffusely thickened, suggesting possible cystitis. No filling defects. Stomach/Bowel: Stomach is within normal limits. Appendix is surgically absent. No evidence of bowel wall thickening, distention, or inflammatory changes. Vascular/Lymphatic: Aortic atherosclerosis. No enlarged abdominal or pelvic lymph nodes. Reproductive: Status post hysterectomy. No adnexal masses. Other: Postoperative scarring along the midline anterior abdominal wall. No free air or free fluid in the abdomen. Musculoskeletal: Degenerative changes  in the spine. No destructive bone lesions. IMPRESSION: 1. Diffuse fatty infiltration of the liver. 2. Bladder wall thickening may indicate cystitis. 3. No evidence of bowel obstruction or inflammation. Aortic Atherosclerosis (ICD10-I70.0). Electronically Signed   By: WLucienne CapersM.D.   On: 10/05/2018 01:33   Dg Chest Port 1 View  Result Date: 10/04/2018 CLINICAL DATA:  68year old female with shortness of breath. EXAM: PORTABLE CHEST 1 VIEW COMPARISON:  Chest radiograph dated 11/27/2017 FINDINGS: Shallow inspiration. No focal consolidation, pleural effusion, or pneumothorax. Stable borderline cardiomegaly. No acute osseous pathology. Lower cervical fixation plate. IMPRESSION: No active disease. Electronically Signed   By: AAnner CreteM.D.   On: 10/04/2018 23:17    Lab Data:  CBC: Recent Labs  Lab 10/04/18 2219 10/05/18 0534  WBC 5.0 16.1*  NEUTROABS 3.9  --   HGB 13.5 11.4*  HCT 43.0 38.0  MCV 96.6 98.7  PLT 186 1852  Basic Metabolic Panel: Recent Labs  Lab 10/04/18 2219 10/05/18 0534  NA 138 138  K 3.6 4.0  CL 101 108  CO2 25 21*  GLUCOSE 104* 170*  BUN 15 15  CREATININE 1.19* 1.11*  CALCIUM 8.6* 7.6*   GFR: Estimated Creatinine Clearance: 58.5 mL/min (A) (by C-G formula based on SCr of 1.11 mg/dL (H)). Liver Function Tests: Recent Labs  Lab 10/04/18 2219 10/05/18 0534  AST 63* 55*  ALT 52* 45*  ALKPHOS 109 82  BILITOT 1.0 1.0  PROT 7.1 5.6*  ALBUMIN 3.7 2.8*   Recent Labs  Lab 10/04/18 2219  LIPASE 30   No results for input(s): AMMONIA in the last 168 hours. Coagulation Profile: No results for input(s): INR, PROTIME in the last 168 hours. Cardiac Enzymes: No results for input(s): CKTOTAL, CKMB, CKMBINDEX, TROPONINI in the last 168 hours. BNP (last 3 results) No results for input(s): PROBNP in the last 8760 hours. HbA1C: Recent Labs    10/05/18 0316 10/05/18 0534  HGBA1C 8.5* 8.5*   CBG: Recent Labs  Lab 10/04/18 2254 10/05/18 0009  10/05/18 0300 10/05/18 0541 10/05/18 0753  GLUCAP 188* 157* 113* 145* 178*   Lipid Profile: No results for input(s): CHOL, HDL, LDLCALC, TRIG, CHOLHDL, LDLDIRECT in the last 72 hours. Thyroid Function Tests: No results for input(s): TSH,  T4TOTAL, FREET4, T3FREE, THYROIDAB in the last 72 hours. Anemia Panel: No results for input(s): VITAMINB12, FOLATE, FERRITIN, TIBC, IRON, RETICCTPCT in the last 72 hours. Urine analysis:    Component Value Date/Time   COLORURINE AMBER (A) 10/04/2018 2219   APPEARANCEUR CLOUDY (A) 10/04/2018 2219   LABSPEC 1.012 10/04/2018 2219   PHURINE 6.0 10/04/2018 2219   GLUCOSEU NEGATIVE 10/04/2018 2219   HGBUR SMALL (A) 10/04/2018 2219   [place] NEGATIVE 10/04/2018 [place] 10/04/2018 2219   PROTEINUR 100 (A) 10/04/2018 2219   NITRITE NEGATIVE 10/04/2018 2219   LEUKOCYTESUR LARGE (A) 10/04/2018 2219       M.D. [facility] 10/05/2018, 10:10 AM  Pager: [phone] Between 7am to 7pm - call Pager - [phone]  After 7pm go to [email or web address] - password [email or web address]  Call night coverage person covering after 7pm

## 2018-10-05 NOTE — ED Notes (Signed)
Report given to Fred,RN.  

## 2018-10-05 NOTE — Progress Notes (Signed)
PHARMACY - PHYSICIAN COMMUNICATION CRITICAL VALUE ALERT - BLOOD CULTURE IDENTIFICATION (BCID)  Ellen Mccoy is an 68 y.o. female who presented to [facility] on 10/04/2018 and admitted for sepsis, dehydration and hypoglycemia.  Assessment:  Antibiotics empirically started to cover urinary vs abdominal source  (vancomycin, cefepime and metronidazole). On 3/1, vancomycin and metronidazole were d/c'ed.  Cefepime was changed to ceftriaxone.  Name of physician (or Provider) Contacted: Dr Tana Coast  Current antibiotics: Ceftriaxone 1gm IV q24h  Changes to prescribed antibiotics recommended: Increase Ceftriaxone to 2gm IV q24h per protocol  Results for orders placed or performed during the hospital encounter of 10/04/18  Blood Culture ID Panel (Reflexed) (Collected: 10/04/2018 10:34 PM)  Result Value Ref Range   Enterococcus species NOT DETECTED NOT DETECTED   Listeria monocytogenes NOT DETECTED NOT DETECTED   Staphylococcus species NOT DETECTED NOT DETECTED   Staphylococcus aureus (BCID) NOT DETECTED NOT DETECTED   Streptococcus species NOT DETECTED NOT DETECTED   Streptococcus agalactiae NOT DETECTED NOT DETECTED   Streptococcus pneumoniae NOT DETECTED NOT DETECTED   Streptococcus pyogenes NOT DETECTED NOT DETECTED   Acinetobacter baumannii NOT DETECTED NOT DETECTED   Enterobacteriaceae species DETECTED (A) NOT DETECTED   Enterobacter cloacae complex NOT DETECTED NOT DETECTED   Escherichia coli DETECTED (A) NOT DETECTED   Klebsiella oxytoca NOT DETECTED NOT DETECTED   Klebsiella pneumoniae NOT DETECTED NOT DETECTED   Proteus species NOT DETECTED NOT DETECTED   Serratia marcescens NOT DETECTED NOT DETECTED   Carbapenem resistance NOT DETECTED NOT DETECTED   Haemophilus influenzae NOT DETECTED NOT DETECTED   Neisseria meningitidis NOT DETECTED NOT DETECTED   Pseudomonas aeruginosa NOT DETECTED NOT DETECTED   Candida albicans NOT DETECTED NOT DETECTED   Candida glabrata NOT DETECTED NOT DETECTED    Candida krusei NOT DETECTED NOT DETECTED   Candida parapsilosis NOT DETECTED NOT DETECTED   Candida tropicalis NOT DETECTED NOT DETECTED    Everette Rank, PharmD 10/05/2018  3:17 PM

## 2018-10-05 NOTE — ED Notes (Signed)
Admitting MD at bedside.

## 2018-10-05 NOTE — ED Notes (Signed)
ED TO INPATIENT HANDOFF REPORT  Name/Age/Gender Ellen Mccoy 68 y.o. female  Code Status    Code Status Orders  (From admission, onward)         Start     Ordered   10/05/18 0235  Full code  Continuous     10/05/18 0234        Code Status History    This patient has a current code status but no historical code status.      Home/SNF/Other Home  Chief Complaint -  Level of Care/Admitting Diagnosis ED Disposition    ED Disposition Condition Comment   Admit  Hospital Area: [facility] [[id number]]  Level of Care: Med-Surg [16]  Diagnosis: Acute pyelonephritis [923300]  Admitting Physician: Toy Baker [[id number]]  Attending Physician: RAI, RIPUDEEP K [[id number]]  Estimated length of stay: past midnight tomorrow  Certification:: I certify this patient will need inpatient services for at least 2 midnights  Bed request comments: 6 east  PT Class (Do Not Modify): Inpatient [101]  PT Acc Code (Do Not Modify): Private [1]       Medical History Past Medical History:  Diagnosis Date  . Blood transfusion without reported diagnosis 1978   after c section  . Cataract   . Depression   . Diabetes mellitus   . Diarrhea   . GERD (gastroesophageal reflux disease)   . Glaucoma   . Hepatitis B    after blood transfusion  . Hyperlipidemia   . Hypertension   . Obesity     Allergies Allergies  Allergen Reactions  . Canagliflozin Diarrhea  . Dapagliflozin Nausea And Vomiting    UTI, yeast  . Dulaglutide Diarrhea  . Empagliflozin Diarrhea  . Metformin Diarrhea    IV Location/Drains/Wounds Patient Lines/Drains/Airways Status   Active Line/Drains/Airways    Name:   Placement date:   Placement time:   Site:   Days:   Peripheral IV 10/04/18 Left Antecubital   10/04/18    2312    Antecubital   1          Labs/Imaging Results for orders placed or performed during the hospital encounter of 10/04/18 (from the past 48 hour(s))  CBG monitoring, ED      Status: Abnormal   Collection Time: 10/04/18  9:52 PM  Result Value Ref Range   Glucose-Capillary 52 (L) 70 - 99 mg/dL  CBG monitoring, ED     Status: Abnormal   Collection Time: 10/04/18 10:11 PM  Result Value Ref Range   Glucose-Capillary 63 (L) 70 - 99 mg/dL  CBC with Differential/Platelet     Status: None   Collection Time: 10/04/18 10:19 PM  Result Value Ref Range   WBC 5.0 4.0 - 10.5 K/uL   RBC 4.45 3.87 - 5.11 MIL/uL   Hemoglobin 13.5 12.0 - 15.0 g/dL   HCT 43.0 36.0 - 46.0 %   MCV 96.6 80.0 - 100.0 fL   MCH 30.3 26.0 - 34.0 pg   MCHC 31.4 30.0 - 36.0 g/dL   RDW 13.4 11.5 - 15.5 %   Platelets 186 150 - 400 K/uL   nRBC 0.0 0.0 - 0.2 %   Neutrophils Relative % 79 %   Neutro Abs 3.9 1.7 - 7.7 K/uL   Lymphocytes Relative 20 %   Lymphs Abs 1.0 0.7 - 4.0 K/uL   Monocytes Relative 1 %   Monocytes Absolute 0.1 0.1 - 1.0 K/uL   Eosinophils Relative 0 %   Eosinophils Absolute 0.0 0.0 -  0.5 K/uL   Basophils Relative 0 %   Basophils Absolute 0.0 0.0 - 0.1 K/uL   Immature Granulocytes 0 %   Abs Immature Granulocytes 0.01 0.00 - 0.07 K/uL    Comment: Performed at [facility], [place] [address] [place], [place] [address]  Comprehensive metabolic panel     Status: Abnormal   Collection Time: 10/04/18 10:19 PM  Result Value Ref Range   Sodium 138 135 - 145 mmol/L   Potassium 3.6 3.5 - 5.1 mmol/L   Chloride 101 98 - 111 mmol/L   CO2 25 22 - 32 mmol/L   Glucose, Bld 104 (H) 70 - 99 mg/dL   BUN 15 8 - 23 mg/dL   Creatinine, Ser 1.19 (H) 0.44 - 1.00 mg/dL   Calcium 8.6 (L) 8.9 - 10.3 mg/dL   Total Protein 7.1 6.5 - 8.1 g/dL   Albumin 3.7 3.5 - 5.0 g/dL   AST 63 (H) 15 - 41 U/L   ALT 52 (H) 0 - 44 U/L   Alkaline Phosphatase 109 38 - 126 U/L   Total Bilirubin 1.0 0.3 - 1.2 mg/dL   GFR calc non Af Amer 47 (L) >60 mL/min   GFR calc Af Amer 54 (L) >60 mL/min   Anion gap 12 5 - 15    Comment: Performed at [facility], [place] [address],  [place], [place] [address]  Lipase, blood     Status: None   Collection Time: 10/04/18 10:19 PM  Result Value Ref Range   Lipase 30 11 - 51 U/L    Comment: Performed at [facility], [place] [address], [place], [place] [address]  Urinalysis, Routine w reflex microscopic     Status: Abnormal   Collection Time: 10/04/18 10:19 PM  Result Value Ref Range   Color, Urine AMBER (A) YELLOW    Comment: BIOCHEMICALS MAY BE AFFECTED BY COLOR   APPearance CLOUDY (A) CLEAR   Specific Gravity, Urine 1.012 1.005 - 1.030   pH 6.0 5.0 - 8.0   Glucose, UA NEGATIVE NEGATIVE mg/dL   Hgb urine dipstick SMALL (A) NEGATIVE   Bilirubin Urine NEGATIVE NEGATIVE   Ketones, ur NEGATIVE NEGATIVE mg/dL   Protein, ur 100 (A) NEGATIVE mg/dL   Nitrite NEGATIVE NEGATIVE   Leukocytes,Ua LARGE (A) NEGATIVE   RBC / HPF 11-20 0 - 5 RBC/hpf   WBC, UA >50 (H) 0 - 5 WBC/hpf   Bacteria, UA MANY (A) NONE SEEN   WBC Clumps PRESENT     Comment: Performed at [facility], [place] [address] [place], [place] [address]  Influenza panel by PCR (type A & B)     Status: None   Collection Time: 10/04/18 10:44 PM  Result Value Ref Range   Influenza A By PCR NEGATIVE NEGATIVE   Influenza B By PCR NEGATIVE NEGATIVE    Comment: (NOTE) The Xpert Xpress Flu assay is intended as an aid in the diagnosis of  influenza and should not be used as a sole basis for treatment.  This  assay is FDA approved for nasopharyngeal swab specimens only. Nasal  washings and aspirates are unacceptable for Xpert Xpress Flu testing. Performed at [facility], [place] [address] [place],  [address]   CBG monitoring, ED     Status: Abnormal   Collection Time: 10/04/18 10:54 PM  Result Value Ref Range   Glucose-Capillary 188 (H) 70 - 99 mg/dL  Lactic acid, plasma  Status: None   Collection Time: 10/04/18 11:01 PM  Result Value Ref Range   Lactic Acid, Venous 1.7 0.5 - 1.9 mmol/L    Comment: Performed  at [facility], [place] [address] [place], [place] [address]  CBG monitoring, ED     Status: Abnormal   Collection Time: 10/05/18 12:09 AM  Result Value Ref Range   Glucose-Capillary 157 (H) 70 - 99 mg/dL  CBG monitoring, ED     Status: Abnormal   Collection Time: 10/05/18  3:00 AM  Result Value Ref Range   Glucose-Capillary 113 (H) 70 - 99 mg/dL  Lactic acid, plasma     Status: None   Collection Time: 10/05/18  3:16 AM  Result Value Ref Range   Lactic Acid, Venous 1.5 0.5 - 1.9 mmol/L    Comment: Performed at [facility], [place] [address] [place], [place] [address]  Procalcitonin     Status: None   Collection Time: 10/05/18  3:16 AM  Result Value Ref Range   Procalcitonin 39.79 ng/mL    Comment:        Interpretation: PCT >= 10 ng/mL: Important systemic inflammatory response, almost exclusively due to severe bacterial sepsis or septic shock. (NOTE)       Sepsis PCT Algorithm           Lower Respiratory Tract                                      Infection PCT Algorithm    ----------------------------     ----------------------------         PCT < 0.25 ng/mL                PCT < 0.10 ng/mL         Strongly encourage             Strongly discourage   discontinuation of antibiotics    initiation of antibiotics    ----------------------------     -----------------------------       PCT 0.25 - 0.50 ng/mL            PCT 0.10 - 0.25 ng/mL               OR       >80% decrease in PCT            Discourage initiation of                                            antibiotics      Encourage discontinuation           of antibiotics    ----------------------------     -----------------------------         PCT >= 0.50 ng/mL              PCT 0.26 - 0.50 ng/mL                AND       <80% decrease in PCT             Encourage initiation of  antibiotics       Encourage continuation           of antibiotics     ----------------------------     -----------------------------        PCT >= 0.50 ng/mL                  PCT > 0.50 ng/mL               AND         increase in PCT                  Strongly encourage                                      initiation of antibiotics    Strongly encourage escalation           of antibiotics                                     -----------------------------                                           PCT <= 0.25 ng/mL                                                 OR                                        > 80% decrease in PCT                                     Discontinue / Do not initiate                                             antibiotics Performed at [place] [address] [place], [place] [address]   Hemoglobin A1c     Status: Abnormal   Collection Time: 10/05/18  3:16 AM  Result Value Ref Range   Hgb A1c MFr Bld 8.5 (H) 4.8 - 5.6 %    Comment: (NOTE) Pre diabetes:          5.7%-6.4% Diabetes:              >6.4% Glycemic control for   <7.0% adults with diabetes    Mean Plasma Glucose 197.25 mg/dL    Comment: Performed at [facility] [address] [place], [place] [address]  Lactic acid, plasma     Status: None   Collection Time: 10/05/18  5:34 AM  Result Value Ref Range   Lactic Acid, Venous 0.9 0.5 - 1.9 mmol/L    Comment: Performed at [facility], [place] [address], [place], [place] [address]  Comprehensive metabolic panel  Status: Abnormal   Collection Time: 10/05/18  5:34 AM  Result Value Ref Range   Sodium 138 135 - 145 mmol/L   Potassium 4.0 3.5 - 5.1 mmol/L   Chloride 108 98 - 111 mmol/L   CO2 21 (L) 22 - 32 mmol/L   Glucose, Bld 170 (H) 70 - 99 mg/dL   BUN 15 8 - 23 mg/dL   Creatinine, Ser 1.11 (H) 0.44 - 1.00 mg/dL   Calcium 7.6 (L) 8.9 - 10.3 mg/dL   Total Protein 5.6 (L) 6.5 - 8.1 g/dL   Albumin 2.8 (L) 3.5 - 5.0 g/dL   AST 55 (H) 15 - 41 U/L   ALT 45 (H) 0 - 44 U/L    Alkaline Phosphatase 82 38 - 126 U/L   Total Bilirubin 1.0 0.3 - 1.2 mg/dL   GFR calc non Af Amer 51 (L) >60 mL/min   GFR calc Af Amer 59 (L) >60 mL/min   Anion gap 9 5 - 15    Comment: Performed at [facility], [place] [address], [place], [place] [address]  CBC     Status: Abnormal   Collection Time: 10/05/18  5:34 AM  Result Value Ref Range   WBC 16.1 (H) 4.0 - 10.5 K/uL   RBC 3.85 (L) 3.87 - 5.11 MIL/uL   Hemoglobin 11.4 (L) 12.0 - 15.0 g/dL   HCT 38.0 36.0 - 46.0 %   MCV 98.7 80.0 - 100.0 fL   MCH 29.6 26.0 - 34.0 pg   MCHC 30.0 30.0 - 36.0 g/dL   RDW 13.6 11.5 - 15.5 %   Platelets 174 150 - 400 K/uL   nRBC 0.0 0.0 - 0.2 %    Comment: Performed at [facility], [place] [address] [place], [place] [address]  Hemoglobin A1c     Status: Abnormal   Collection Time: 10/05/18  5:34 AM  Result Value Ref Range   Hgb A1c MFr Bld 8.5 (H) 4.8 - 5.6 %    Comment: (NOTE) Pre diabetes:          5.7%-6.4% Diabetes:              >6.4% Glycemic control for   <7.0% adults with diabetes    Mean Plasma Glucose 197.25 mg/dL    Comment: Performed at [facility] [address] [place], [place] [address]  CBG monitoring, ED     Status: Abnormal   Collection Time: 10/05/18  5:41 AM  Result Value Ref Range   Glucose-Capillary 145 (H) 70 - 99 mg/dL  CBG monitoring, ED     Status: Abnormal   Collection Time: 10/05/18  7:53 AM  Result Value Ref Range   Glucose-Capillary 178 (H) 70 - 99 mg/dL   Ct Abdomen Pelvis W Contrast  Result Date: 10/05/2018 CLINICAL DATA:  Pyelonephritis. Possible sepsis. EXAM: CT ABDOMEN AND PELVIS WITH CONTRAST TECHNIQUE: Multidetector CT imaging of the abdomen and pelvis was performed using the standard protocol following bolus administration of intravenous contrast. CONTRAST:  167mL ISOVUE-300 IOPAMIDOL (ISOVUE-300) INJECTION 61% COMPARISON:  None. FINDINGS: Lower chest: Mild dependent changes in the lung bases. Hepatobiliary:  Diffuse fatty infiltration of the liver. No focal liver lesions. Gallbladder is surgically absent. No bile duct dilatation. Pancreas: Unremarkable. No pancreatic ductal dilatation or surrounding inflammatory changes. Spleen: Normal in size without focal abnormality. Adrenals/Urinary Tract: Kidneys are symmetrical in size and shape. Nephrograms are homogeneous and symmetrical. No hydronephrosis or hydroureter. Bladder wall is diffusely thickened, suggesting possible  cystitis. No filling defects. Stomach/Bowel: Stomach is within normal limits. Appendix is surgically absent. No evidence of bowel wall thickening, distention, or inflammatory changes. Vascular/Lymphatic: Aortic atherosclerosis. No enlarged abdominal or pelvic lymph nodes. Reproductive: Status post hysterectomy. No adnexal masses. Other: Postoperative scarring along the midline anterior abdominal wall. No free air or free fluid in the abdomen. Musculoskeletal: Degenerative changes in the spine. No destructive bone lesions. IMPRESSION: 1. Diffuse fatty infiltration of the liver. 2. Bladder wall thickening may indicate cystitis. 3. No evidence of bowel obstruction or inflammation. Aortic Atherosclerosis (ICD10-I70.0). Electronically Signed   By: Lucienne Capers M.D.   On: 10/05/2018 01:33   Dg Chest Port 1 View  Result Date: 10/04/2018 CLINICAL DATA:  68 year old female with shortness of breath. EXAM: PORTABLE CHEST 1 VIEW COMPARISON:  Chest radiograph dated 11/27/2017 FINDINGS: Shallow inspiration. No focal consolidation, pleural effusion, or pneumothorax. Stable borderline cardiomegaly. No acute osseous pathology. Lower cervical fixation plate. IMPRESSION: No active disease. Electronically Signed   By: Anner Crete M.D.   On: 10/04/2018 23:17    Pending Labs Unresulted Labs (From admission, onward)    Start     Ordered   10/06/18 0500  CBC  Daily,   R     10/05/18 0638   10/06/18 9147  Basic metabolic panel  Daily,   R     10/05/18 0638    10/05/18 0500  HIV antibody (Routine Testing)  Tomorrow morning,   R     10/05/18 0234   10/05/18 0500  Magnesium  Tomorrow morning,   R    Comments:  Call MD if <1.5    10/05/18 0234   10/05/18 0500  Phosphorus  Tomorrow morning,   R     10/05/18 0234   10/05/18 0500  TSH  Once,   R    Comments:  Cancel if already done within 1 month and notify MD    10/05/18 0234   10/05/18 0041  Lactoferrin, Fecal,Qualitative  Once,   R     10/05/18 0040   10/05/18 0040  Gastrointestinal Panel by PCR , Stool  (Gastrointestinal Panel by PCR, Stool)  Once,   R     10/05/18 0040   10/04/18 2229  Culture, blood (Routine X 2) w Reflex to ID Panel  BLOOD CULTURE X 2,   R     10/04/18 2228   10/04/18 2213  Urine Culture  ONCE - STAT,   STAT     10/04/18 2212          Vitals/Pain Today's Vitals   10/05/18 0833 10/05/18 0900 10/05/18 0912 10/05/18 0930  BP: (!) 123/49  (!) 131/54 (!) 162/70  Pulse: 100 (!) 102 99 96  Resp: 13 20 (!) 22 17  Temp: 98 F (36.7 C)     TempSrc: Oral     SpO2: 94% 95% 93% 95%  Weight:      Height:      PainSc: 2        Isolation Precautions Enteric precautions (UV disinfection)  Medications Medications  dextrose (GLUTOSE) 40 % oral gel 37.5 g (37.5 g Oral Given 10/04/18 2158)  0.9 %  sodium chloride infusion ( Intravenous New Bag/Given 10/05/18 0758)  metroNIDAZOLE (FLAGYL) IVPB 500 mg (500 mg Intravenous New Bag/Given 10/05/18 0758)  cefTRIAXone (ROCEPHIN) 1 g in sodium chloride 0.9 % 100 mL IVPB (has no administration in time range)  aspirin EC tablet 81 mg (has no administration in time range)  pravastatin (PRAVACHOL) tablet 40  mg (has no administration in time range)  pantoprazole (PROTONIX) EC tablet 40 mg (has no administration in time range)  acetaminophen (TYLENOL) tablet 650 mg (650 mg Oral Given 10/05/18 0855)    Or  acetaminophen (TYLENOL) suppository 650 mg ( Rectal See Alternative 10/05/18 0855)  ondansetron (ZOFRAN) tablet 4 mg (has no  administration in time range)    Or  ondansetron (ZOFRAN) injection 4 mg (has no administration in time range)  insulin aspart (novoLOG) injection 0-9 Units (2 Units Subcutaneous Given 10/05/18 0832)  enoxaparin (LOVENOX) injection 40 mg (has no administration in time range)  0.9 %  sodium chloride infusion ( Intravenous New Bag/Given 10/05/18 0114)  albuterol (PROVENTIL) (2.5 MG/3ML) 0.083% nebulizer solution 2.5 mg (has no administration in time range)  sodium chloride (PF) 0.9 % injection (has no administration in time range)  sodium chloride 0.9 % bolus 1,000 mL (0 mLs Intravenous Stopped 10/04/18 2347)  dextrose 50 % solution 50 mL (50 mLs Intravenous Given 10/04/18 2248)  metoCLOPramide (REGLAN) injection 5 mg (5 mg Intravenous Given 10/04/18 2248)  ceFEPIme (MAXIPIME) 2 g in sodium chloride 0.9 % 100 mL IVPB (0 g Intravenous Stopped 10/04/18 2347)  acetaminophen (TYLENOL) 325 MG tablet (650 mg  Given 10/04/18 2301)  vancomycin (VANCOCIN) 2,000 mg in sodium chloride 0.9 % 500 mL IVPB (0 mg Intravenous Stopped 10/05/18 0215)  sodium chloride 0.9 % bolus 500 mL (0 mLs Intravenous Stopped 10/05/18 0215)  iopamidol (ISOVUE-300) 61 % injection 100 mL (100 mLs Intravenous Contrast Given 10/05/18 0059)  potassium chloride 10 mEq in 100 mL IVPB (0 mEq Intravenous Stopped 10/05/18 0550)    Mobility walks with person assist

## 2018-10-06 LAB — CBC
HCT: 37.7 % (ref 36.0–46.0)
Hemoglobin: 11.3 g/dL — ABNORMAL LOW (ref 12.0–15.0)
MCH: 29.4 pg (ref 26.0–34.0)
MCHC: 30 g/dL (ref 30.0–36.0)
MCV: 97.9 fL (ref 80.0–100.0)
NRBC: 0 % (ref 0.0–0.2)
Platelets: 178 10*3/uL (ref 150–400)
RBC: 3.85 MIL/uL — ABNORMAL LOW (ref 3.87–5.11)
RDW: 13.3 % (ref 11.5–15.5)
WBC: 11.2 10*3/uL — ABNORMAL HIGH (ref 4.0–10.5)

## 2018-10-06 LAB — GASTROINTESTINAL PANEL BY PCR, STOOL (REPLACES STOOL CULTURE)
Adenovirus F40/41: NOT DETECTED
Astrovirus: NOT DETECTED
Campylobacter species: NOT DETECTED
Cryptosporidium: NOT DETECTED
Cyclospora cayetanensis: NOT DETECTED
Entamoeba histolytica: NOT DETECTED
Enteroaggregative E coli (EAEC): NOT DETECTED
Enteropathogenic E coli (EPEC): NOT DETECTED
Enterotoxigenic E coli (ETEC): NOT DETECTED
Giardia lamblia: NOT DETECTED
NOROVIRUS GI/GII: NOT DETECTED
Plesimonas shigelloides: NOT DETECTED
Rotavirus A: NOT DETECTED
SHIGELLA/ENTEROINVASIVE E COLI (EIEC): NOT DETECTED
Salmonella species: NOT DETECTED
Sapovirus (I, II, IV, and V): NOT DETECTED
Shiga like toxin producing E coli (STEC): NOT DETECTED
Vibrio cholerae: NOT DETECTED
Vibrio species: NOT DETECTED
Yersinia enterocolitica: NOT DETECTED

## 2018-10-06 LAB — GLUCOSE, CAPILLARY
Glucose-Capillary: 108 mg/dL — ABNORMAL HIGH (ref 70–99)
Glucose-Capillary: 110 mg/dL — ABNORMAL HIGH (ref 70–99)
Glucose-Capillary: 113 mg/dL — ABNORMAL HIGH (ref 70–99)
Glucose-Capillary: 183 mg/dL — ABNORMAL HIGH (ref 70–99)
Glucose-Capillary: 189 mg/dL — ABNORMAL HIGH (ref 70–99)
Glucose-Capillary: 198 mg/dL — ABNORMAL HIGH (ref 70–99)
Glucose-Capillary: 57 mg/dL — ABNORMAL LOW (ref 70–99)

## 2018-10-06 LAB — BASIC METABOLIC PANEL
ANION GAP: 7 (ref 5–15)
BUN: 14 mg/dL (ref 8–23)
CO2: 21 mmol/L — ABNORMAL LOW (ref 22–32)
Calcium: 7.9 mg/dL — ABNORMAL LOW (ref 8.9–10.3)
Chloride: 108 mmol/L (ref 98–111)
Creatinine, Ser: 0.9 mg/dL (ref 0.44–1.00)
GFR calc Af Amer: >60 mL/min (ref >60)
GFR calc non Af Amer: >60 mL/min (ref >60)
Glucose, Bld: 178 mg/dL — ABNORMAL HIGH (ref 70–99)
POTASSIUM: 3.6 mmol/L (ref 3.5–5.1)
Sodium: 136 mmol/L (ref 135–145)

## 2018-10-06 LAB — LACTOFERRIN, FECAL, QUALITATIVE: Lactoferrin, Fecal, Qual: NEGATIVE

## 2018-10-06 NOTE — Progress Notes (Signed)
[facility]                                                                              Patient Demographics  Ellen Mccoy, is a 68 y.o. female, DOB - [birth year], JQZ:[id number]  Admit date - 10/04/2018   Admitting Physician Toy Baker, MD  Outpatient Primary MD for the patient is Aletha Halim., PA-C  Outpatient specialists:   LOS - 1  days   Medical records reviewed and are as summarized below:    Chief Complaint  Patient presents with  . Hypoglycemia       Brief summary   Patient is a 68 year old female with diabetes mellitus type 2 on insulin pump, retinopathy, neuropathy, hyperlipidemia presented with nausea, vomiting, abdominal discomfort with back pain.  Patient also reported low blood sugars at home.  Patient reported symptoms of UTI with dysuria, back pain and increased frequency 3 days ago.  Patient was started on Macrobid however had only 1 dose on the day of admission.  While she was in [place], patient noticed her blood sugars were starting to run low and could not use insulin pump because of malfunctioning and turned it off. Patient was admitted for acute pyelonephritis  Assessment & Plan    Principal Problem:   Acute pyelonephritis, sepsis, acute cystitis, E. coli bacteremia -Patient met sepsis criteria at the time of admission with tachycardia, fever, leukocytosis, elevated procalcitonin UTI -Blood cultures positive for E. coli, urine culture positive for E. coli, pending sensitivities  -Continue IV Rocephin until sensitivities are available -CT abdomen consistent with acute cystitis  Active Problems:   Chronic diarrhea -Outpatient follow-up with GI -Colonoscopy in 2014 was normal, (Dr. Ardis Hughs ) was recommended cholestyramine for diarrhea    DM (diabetes mellitus), type 2 with complications ([place]) with hypoglycemia, brittle diabetes -Hemoglobin A1c 8.5 -Continue patient's insulin regimen with Levemir and insulin pump as  requested by patient and her family -Please see my note from yesterday.    Dehydration -Continue IV fluid hydration  Code Status: Full CODE STATUS DVT Prophylaxis:  Lovenox  Family Communication: Discussed in detail with the patient, all imaging results, lab results explained to the patient    Disposition Plan: Awaiting sensitivities, once available will be able to discharge home.  Time Spent in minutes 35 minutes  Procedures:  CT abdomen  Consultants:   none  Antimicrobials:   Anti-infectives (From admission, onward)   Start     Dose/Rate Mccoy Frequency Ordered Stop   10/05/18 2300  cefTRIAXone (ROCEPHIN) 1 g in sodium chloride 0.9 % 100 mL IVPB  Status:  Discontinued     1 g 200 mL/hr over 30 Minutes Intravenous Every 24 hours 10/05/18 0234 10/05/18 1516   10/05/18 2300  cefTRIAXone (ROCEPHIN) 2 g in sodium chloride 0.9 % 100 mL IVPB     2 g 200 mL/hr over 30 Minutes Intravenous Every 24 hours 10/05/18 1517     10/04/18 2315  vancomycin (VANCOCIN) 2,000 mg in sodium chloride 0.9 % 500 mL IVPB     2,000 mg 250 mL/hr over 120 Minutes Intravenous  Once 10/04/18 2308 10/05/18 0215   10/04/18  2300  ceFEPIme (MAXIPIME) 2 g in sodium chloride 0.9 % 100 mL IVPB     2 g 200 mL/hr over 30 Minutes Intravenous  Once 10/04/18 2250 10/04/18 2347   10/04/18 2300  metroNIDAZOLE (FLAGYL) IVPB 500 mg  Status:  Discontinued     500 mg 100 mL/hr over 60 Minutes Intravenous Every 8 hours 10/04/18 2250 10/05/18 1400   10/04/18 2300  vancomycin (VANCOCIN) IVPB 1000 mg/200 mL premix  Status:  Discontinued     1,000 mg 200 mL/hr over 60 Minutes Intravenous  Once 10/04/18 2250 10/04/18 2308         Medications  Scheduled Meds: . aspirin EC  81 mg Oral Daily  . enoxaparin (LOVENOX) injection  40 mg Subcutaneous Q24H  . insulin detemir  24 Units Subcutaneous QHS  . insulin detemir  30 Units Subcutaneous Daily  . insulin pump   Subcutaneous TID AC, HS, 0200  . pantoprazole  40 mg Oral  Daily  . pravastatin  40 mg Oral Daily   Continuous Infusions: . cefTRIAXone (ROCEPHIN)  IV 2 g (10/05/18 2254)   PRN Meds:.acetaminophen **OR** acetaminophen, albuterol, dextrose, guaiFENesin-dextromethorphan, HYDROmorphone (DILAUDID) injection, ondansetron **OR** ondansetron (ZOFRAN) IV      Subjective:   Ellen Mccoy was seen and examined today.  Feels a whole lot better today, no fevers.  Still has left-sided flank pain.  No hematuria. Patient denies dizziness, chest pain, shortness of breath,  N/V, new weakness, numbess, tingling.   Objective:   Vitals:   10/06/18 0009 10/06/18 0201 10/06/18 0430 10/06/18 0601  BP: 139/73 (!) 112/55 (!) 159/80 (!) 154/70  Pulse: 99 (!) 102 (!) 107 (!) 107  Resp: '16 18 18 18  ' Temp: 98.4 F (36.9 C) 97.8 F (36.6 C) 98.8 F (37.1 C) 98.4 F (36.9 C)  TempSrc: Oral Oral Oral Oral  SpO2: 97% 96% 95% 95%  Weight:      Height:        Intake/Output Summary (Last 24 hours) at 10/06/2018 1309 Last data filed at 10/06/2018 0944 Gross per 24 hour  Intake 480 ml  Output 2850 ml  Net -2370 ml     Wt Readings from Last 3 Encounters:  10/05/18 108.9 kg  04/03/18 109.2 kg  01/22/18 108.9 kg    Physical Exam  General: Alert and oriented x 3, NAD  Eyes:   HEENT:  Atraumatic, normocephalic  Cardiovascular: S1 S2 clear RRR. No pedal edema b/l  Respiratory: CTAB, no wheezing, rales or rhonchi  Gastrointestinal: Soft, nontender, nondistended, NBS, left CVAT  Ext: no pedal edema bilaterally  Neuro: no new deficits  Musculoskeletal: No cyanosis, clubbing  Skin: No rashes  Psych: Normal affect and demeanor, alert and oriented x3    Data Reviewed:  I have personally reviewed following labs and imaging studies  Micro Results Recent Results (from the past 240 hour(s))  Urine Culture     Status: Abnormal (Preliminary result)   Collection Time: 10/04/18 10:19 PM  Result Value Ref Range Status   Specimen Description   Final    URINE,  RANDOM Performed at [facility] [address], [place], [place] [address]    Special Requests   Final    NONE Performed at [facility], [place] [address], [place], [place] [address]    Culture >=100,000 COLONIES/mL GRAM NEGATIVE RODS (A)  Final   Report Status PENDING  Incomplete  Culture, blood (Routine X 2) w Reflex to ID Panel  Status: Abnormal (Preliminary result)   Collection Time: 10/04/18 10:34 PM  Result Value Ref Range Status   Specimen Description   Final    BLOOD LEFT FOREARM Performed at [facility] [address], [place], [place] [address]    Special Requests   Final    BOTTLES DRAWN AEROBIC AND ANAEROBIC Blood Culture results may not be optimal due to an inadequate volume of blood received in culture bottles Performed at [facility] [address], [place], [place] [address]    Culture  Setup Time   Final    GRAM NEGATIVE RODS IN BOTH AEROBIC AND ANAEROBIC BOTTLES CRITICAL RESULT CALLED TO, READ BACK BY AND VERIFIED WITH: L. POINDEXTER PHARMD, AT 1513 10/05/18 BY Rush Landmark Performed at [facility], [place] [address] [place], [place] [address]    Culture ESCHERICHIA COLI (A)  Final   Report Status PENDING  Incomplete  Blood Culture ID Panel (Reflexed)     Status: Abnormal   Collection Time: 10/04/18 10:34 PM  Result Value Ref Range Status   Enterococcus species NOT DETECTED NOT DETECTED Final   Listeria monocytogenes NOT DETECTED NOT DETECTED Final   Staphylococcus species NOT DETECTED NOT DETECTED Final   Staphylococcus aureus (BCID) NOT DETECTED NOT DETECTED Final   Streptococcus species NOT DETECTED NOT DETECTED Final   Streptococcus agalactiae NOT DETECTED NOT DETECTED Final   Streptococcus pneumoniae NOT DETECTED NOT DETECTED Final   Streptococcus pyogenes NOT DETECTED NOT DETECTED Final   Acinetobacter baumannii NOT DETECTED NOT DETECTED Final   Enterobacteriaceae  species DETECTED (A) NOT DETECTED Final    Comment: Enterobacteriaceae represent a large family of gram-negative bacteria, not a single organism. CRITICAL RESULT CALLED TO, READ BACK BY AND VERIFIED WITH: L. POINDEXTER PHARMD, AT 1513 10/05/18 BY D. VANHOOK    Enterobacter cloacae complex NOT DETECTED NOT DETECTED Final   Escherichia coli DETECTED (A) NOT DETECTED Final    Comment: CRITICAL RESULT CALLED TO, READ BACK BY AND VERIFIED WITH: L. POINDEXTER PHARMD, AT 1513 10/05/18 BY D. VANHOOK    Klebsiella oxytoca NOT DETECTED NOT DETECTED Final   Klebsiella pneumoniae NOT DETECTED NOT DETECTED Final   Proteus species NOT DETECTED NOT DETECTED Final   Serratia marcescens NOT DETECTED NOT DETECTED Final   Carbapenem resistance NOT DETECTED NOT DETECTED Final   Haemophilus influenzae NOT DETECTED NOT DETECTED Final   Neisseria meningitidis NOT DETECTED NOT DETECTED Final   Pseudomonas aeruginosa NOT DETECTED NOT DETECTED Final   Candida albicans NOT DETECTED NOT DETECTED Final   Candida glabrata NOT DETECTED NOT DETECTED Final   Candida krusei NOT DETECTED NOT DETECTED Final   Candida parapsilosis NOT DETECTED NOT DETECTED Final   Candida tropicalis NOT DETECTED NOT DETECTED Final    Comment: Performed at [facility], [place] [address] [place], [place] [address]  Culture, blood (Routine X 2) w Reflex to ID Panel     Status: None (Preliminary result)   Collection Time: 10/04/18 10:35 PM  Result Value Ref Range Status   Specimen Description   Final    BLOOD RIGHT ANTECUBITAL Performed at [facility] [address], [place], [place] [address]    Special Requests   Final    BOTTLES DRAWN AEROBIC AND ANAEROBIC Blood Culture adequate volume Performed at [facility] [address] [place], [place] [address]    Culture  Setup Time   Final    GRAM NEGATIVE RODS IN BOTH AEROBIC  AND ANAEROBIC BOTTLES Performed at [facility], [place] [address] [place], [place] [address]    Culture GRAM NEGATIVE RODS IDENTIFICATION TO FOLLOW   Final   Report Status PENDING  Incomplete  Gastrointestinal Panel by PCR , Stool     Status: None   Collection Time: 10/05/18 10:20 AM  Result Value Ref Range Status   Campylobacter species NOT DETECTED NOT DETECTED Final   Plesimonas shigelloides NOT DETECTED NOT DETECTED Final   Salmonella species NOT DETECTED NOT DETECTED Final   Yersinia enterocolitica NOT DETECTED NOT DETECTED Final   Vibrio species NOT DETECTED NOT DETECTED Final   Vibrio cholerae NOT DETECTED NOT DETECTED Final   Enteroaggregative E coli (EAEC) NOT DETECTED NOT DETECTED Final   Enteropathogenic E coli (EPEC) NOT DETECTED NOT DETECTED Final   Enterotoxigenic E coli (ETEC) NOT DETECTED NOT DETECTED Final   Shiga like toxin producing E coli (STEC) NOT DETECTED NOT DETECTED Final   Shigella/Enteroinvasive E coli (EIEC) NOT DETECTED NOT DETECTED Final   Cryptosporidium NOT DETECTED NOT DETECTED Final   Cyclospora cayetanensis NOT DETECTED NOT DETECTED Final   Entamoeba histolytica NOT DETECTED NOT DETECTED Final   Giardia lamblia NOT DETECTED NOT DETECTED Final   Adenovirus F40/41 NOT DETECTED NOT DETECTED Final   Astrovirus NOT DETECTED NOT DETECTED Final   Norovirus GI/GII NOT DETECTED NOT DETECTED Final   Rotavirus A NOT DETECTED NOT DETECTED Final   Sapovirus (I, II, IV, and V) NOT DETECTED NOT DETECTED Final    Comment: Performed at [facility], [address] [place], [place] [address]    Radiology Reports Ct Abdomen Pelvis W Contrast  Result Date: 10/05/2018 CLINICAL DATA:  Pyelonephritis. Possible sepsis. EXAM: CT ABDOMEN AND PELVIS WITH CONTRAST TECHNIQUE: Multidetector CT imaging of the abdomen and pelvis was performed using the standard protocol following bolus administration of intravenous contrast. CONTRAST:  179m ISOVUE-300 IOPAMIDOL (ISOVUE-300) INJECTION 61% COMPARISON:  None. FINDINGS: Lower chest: Mild  dependent changes in the lung bases. Hepatobiliary: Diffuse fatty infiltration of the liver. No focal liver lesions. Gallbladder is surgically absent. No bile duct dilatation. Pancreas: Unremarkable. No pancreatic ductal dilatation or surrounding inflammatory changes. Spleen: Normal in size without focal abnormality. Adrenals/Urinary Tract: Kidneys are symmetrical in size and shape. Nephrograms are homogeneous and symmetrical. No hydronephrosis or hydroureter. Bladder wall is diffusely thickened, suggesting possible cystitis. No filling defects. Stomach/Bowel: Stomach is within normal limits. Appendix is surgically absent. No evidence of bowel wall thickening, distention, or inflammatory changes. Vascular/Lymphatic: Aortic atherosclerosis. No enlarged abdominal or pelvic lymph nodes. Reproductive: Status post hysterectomy. No adnexal masses. Other: Postoperative scarring along the midline anterior abdominal wall. No free air or free fluid in the abdomen. Musculoskeletal: Degenerative changes in the spine. No destructive bone lesions. IMPRESSION: 1. Diffuse fatty infiltration of the liver. 2. Bladder wall thickening may indicate cystitis. 3. No evidence of bowel obstruction or inflammation. Aortic Atherosclerosis (ICD10-I70.0). Electronically Signed   By: WLucienne CapersM.D.   On: 10/05/2018 01:33   Dg Chest Port 1 View  Result Date: 10/04/2018 CLINICAL DATA:  68year old female with shortness of breath. EXAM: PORTABLE CHEST 1 VIEW COMPARISON:  Chest radiograph dated 11/27/2017 FINDINGS: Shallow inspiration. No focal consolidation, pleural effusion, or pneumothorax. Stable borderline cardiomegaly. No acute osseous pathology. Lower cervical fixation plate. IMPRESSION: No active disease. Electronically Signed   By: AAnner CreteM.D.   On: 10/04/2018 23:17    Lab Data:  CBC: Recent Labs  Lab 10/04/18 2219 10/05/18 0534 10/06/18  0454  WBC 5.0 16.1* 11.2*  NEUTROABS 3.9  --   --   HGB 13.5 11.4*  11.3*  HCT 43.0 38.0 37.7  MCV 96.6 98.7 97.9  PLT 186 174 459   Basic Metabolic Panel: Recent Labs  Lab 10/04/18 2219 10/05/18 0534 10/06/18 0454  NA 138 138 136  K 3.6 4.0 3.6  CL 101 108 108  CO2 25 21* 21*  GLUCOSE 104* 170* 178*  BUN '15 15 14  ' CREATININE 1.19* 1.11* 0.90  CALCIUM 8.6* 7.6* 7.9*  MG  --  1.6*  --   PHOS  --  3.2  --    GFR: Estimated Creatinine Clearance: 72.2 mL/min (by C-G formula based on SCr of 0.9 mg/dL). Liver Function Tests: Recent Labs  Lab 10/04/18 2219 10/05/18 0534  AST 63* 55*  ALT 52* 45*  ALKPHOS 109 82  BILITOT 1.0 1.0  PROT 7.1 5.6*  ALBUMIN 3.7 2.8*   Recent Labs  Lab 10/04/18 2219  LIPASE 30   No results for input(s): AMMONIA in the last 168 hours. Coagulation Profile: No results for input(s): INR, PROTIME in the last 168 hours. Cardiac Enzymes: No results for input(s): CKTOTAL, CKMB, CKMBINDEX, TROPONINI in the last 168 hours. BNP (last 3 results) No results for input(s): PROBNP in the last 8760 hours. HbA1C: Recent Labs    10/05/18 0316 10/05/18 0534  HGBA1C 8.5* 8.5*   CBG: Recent Labs  Lab 10/06/18 0045 10/06/18 0158 10/06/18 0600 10/06/18 0807 10/06/18 1132  GLUCAP 110* 113* 189* 183* 198*   Lipid Profile: No results for input(s): CHOL, HDL, LDLCALC, TRIG, CHOLHDL, LDLDIRECT in the last 72 hours. Thyroid Function Tests: Recent Labs    10/05/18 0534  TSH 1.884   Anemia Panel: No results for input(s): VITAMINB12, FOLATE, FERRITIN, TIBC, IRON, RETICCTPCT in the last 72 hours. Urine analysis:    Component Value Date/Time   COLORURINE AMBER (A) 10/04/2018 2219   APPEARANCEUR CLOUDY (A) 10/04/2018 2219   LABSPEC 1.012 10/04/2018 2219   PHURINE 6.0 10/04/2018 2219   GLUCOSEU NEGATIVE 10/04/2018 2219   HGBUR SMALL (A) 10/04/2018 2219   [place] NEGATIVE 10/04/2018 [place] 10/04/2018 2219   PROTEINUR 100 (A) 10/04/2018 2219   NITRITE NEGATIVE 10/04/2018 2219   LEUKOCYTESUR  LARGE (A) 10/04/2018 2219     Hiram Mciver M.D. [facility] 10/06/2018, 1:09 PM  Pager: [phone] Between 7am to 7pm - call Pager - [phone]  After 7pm go to [email or web address] - password [email or web address]  Call night coverage person covering after 7pm

## 2018-10-06 NOTE — Progress Notes (Signed)
Inpatient Diabetes Program Recommendations  AACE/ADA: New Consensus Statement on Inpatient Glycemic Control (2015)  Target Ranges:  Prepandial:   less than 140 mg/dL      Peak postprandial:   less than 180 mg/dL (1-2 hours)      Critically ill patients:  140 - 180 mg/dL   Lab Results  Component Value Date   GLUCAP 198 (H) 10/06/2018   HGBA1C 8.5 (H) 10/05/2018    Review of Glycemic Control  Had 57 mg/dL blood sugar at midnight this am. Had bolused 21 units per insulin pump. Pt states she bolused more than she should have since her intake was lower than expected. Pt states her MD (endo) ordered Levemir 30 units in am and 24 units QHS + insulin pump. (See settings in previous note.) I explained to pt that our insulin pump policy states "D/C all SQ insulin when starting insulin pump." Pt states using both SQ basal insulin and the insulin pump is what she does at home. Her endo is adjusting pump settings since pt has had hypoglycemia at home.  HgbA1C - 8.5% - Discussed with pt. Pt states this is a big improvement from 9.5% and she's "moving in the right direction." Discussed diet, exercise and importance of stress management. Discussed sick day rules with pt. Talked with pt about healthy diet, portion control and eating regular meals and high fiber snacks. Pt voiced understanding. Paged MD regarding use of insulin pump and SQ basal insulin. Talked with RN.   Thank you. Lorenda Peck, RD, LDN, CDE Inpatient Diabetes Coordinator [phone]

## 2018-10-07 LAB — CULTURE, BLOOD (ROUTINE X 2): Special Requests: ADEQUATE

## 2018-10-07 LAB — GLUCOSE, CAPILLARY
GLUCOSE-CAPILLARY: 176 mg/dL — AB (ref 70–99)
Glucose-Capillary: 89 mg/dL (ref 70–99)
Glucose-Capillary: 155 mg/dL — ABNORMAL HIGH (ref 70–99)
Glucose-Capillary: 139 mg/dL — ABNORMAL HIGH (ref 70–99)

## 2018-10-07 LAB — CBC
HCT: 36.3 % (ref 36.0–46.0)
Hemoglobin: 11.2 g/dL — ABNORMAL LOW (ref 12.0–15.0)
MCH: 29.9 pg (ref 26.0–34.0)
MCHC: 30.9 g/dL (ref 30.0–36.0)
MCV: 96.8 fL (ref 80.0–100.0)
Platelets: 198 10*3/uL (ref 150–400)
RBC: 3.75 MIL/uL — ABNORMAL LOW (ref 3.87–5.11)
RDW: 13.4 % (ref 11.5–15.5)
WBC: 10.3 10*3/uL (ref 4.0–10.5)
nRBC: 0 % (ref 0.0–0.2)

## 2018-10-07 LAB — BASIC METABOLIC PANEL
Anion gap: 8 (ref 5–15)
BUN: 11 mg/dL (ref 8–23)
CO2: 23 mmol/L (ref 22–32)
Calcium: 8.4 mg/dL — ABNORMAL LOW (ref 8.9–10.3)
Chloride: 110 mmol/L (ref 98–111)
Creatinine, Ser: 1.05 mg/dL — ABNORMAL HIGH (ref 0.44–1.00)
GFR calc Af Amer: >60 mL/min (ref >60)
GFR calc non Af Amer: 55 mL/min — ABNORMAL LOW (ref >60)
Glucose, Bld: 179 mg/dL — ABNORMAL HIGH (ref 70–99)
Potassium: 4.3 mmol/L (ref 3.5–5.1)
Sodium: 141 mmol/L (ref 135–145)

## 2018-10-07 LAB — URINE CULTURE: Culture: >=100000 — AB

## 2018-10-07 MED ORDER — OMEGA-3 FATTY ACIDS 1000 MG PO CAPS: 1.0000 g | ORAL_CAPSULE | Freq: Two times a day (BID) | ORAL | Status: DC

## 2018-10-07 MED ORDER — OMEGA-3-ACID ETHYL ESTERS 1 G PO CAPS: 1.0000 g | ORAL_CAPSULE | Freq: Two times a day (BID) | ORAL | Status: DC

## 2018-10-07 MED ORDER — CEPHALEXIN 500 MG PO CAPS: 500.0000 mg | ORAL_CAPSULE | Freq: Three times a day (TID) | ORAL | Status: DC

## 2018-10-07 MED ORDER — TRAMADOL HCL 50 MG PO TABS: 50.0000 mg | ORAL_TABLET | Freq: Two times a day (BID) | ORAL | 0 refills | Status: AC | PRN

## 2018-10-07 MED ORDER — VITAMIN C 500 MG PO TABS
1000.0000 mg | ORAL_TABLET | Freq: Every day | ORAL | Status: DC
  Administered 2018-10-07: 1000 mg via ORAL
  Filled 2018-10-07: qty 2

## 2018-10-07 MED ORDER — LOPERAMIDE HCL 2 MG PO TABS: 2.0000 mg | ORAL_TABLET | Freq: Three times a day (TID) | ORAL | 0 refills | Status: AC | PRN

## 2018-10-07 MED ORDER — PROMETHAZINE HCL 12.5 MG PO TABS: 12.5000 mg | ORAL_TABLET | Freq: Four times a day (QID) | ORAL | 3 refills | Status: AC | PRN

## 2018-10-07 MED ORDER — CEPHALEXIN 500 MG PO CAPS: 500.0000 mg | ORAL_CAPSULE | Freq: Three times a day (TID) | ORAL | 0 refills | Status: AC

## 2018-10-07 MED ORDER — LISINOPRIL 10 MG PO TABS
10.0000 mg | ORAL_TABLET | Freq: Every day | ORAL | Status: DC
  Administered 2018-10-07: 10 mg via ORAL
  Filled 2018-10-07: qty 1

## 2018-10-07 MED ORDER — VITAMIN D 25 MCG (1000 UNIT) PO TABS
1000.0000 [IU] | ORAL_TABLET | Freq: Every day | ORAL | Status: DC
  Filled 2018-10-07: qty 1

## 2018-10-07 NOTE — Discharge Summary (Signed)
Physician Discharge Summary   Patient ID: Ellen Mccoy MRN: [id number] DOB/AGE: [birth year] 68 y.o.  Admit date: 10/04/2018 Discharge date: 10/07/2018  Primary Care Physician:  Aletha Halim., PA-C   Recommendations for Outpatient Follow-up:  1. Follow up with PCP in 1-2 weeks  Home Health: None  Equipment/Devices:   Discharge Condition: stable  CODE STATUS: FULL  Diet recommendation:    Discharge Diagnoses:    . Sepsis ([place]) with E. coli bacteremia . Chronic diarrhea . DM (diabetes mellitus), type 2 with complications ([place]) . Hypoglycemia due to insulin . Dehydration . UTI with acute cystitis, acute left pyelonephritis   Consults: None    Allergies:   Allergies  Allergen Reactions  . Canagliflozin Diarrhea  . Dapagliflozin Nausea And Vomiting    UTI, yeast  . Dulaglutide Diarrhea  . Empagliflozin Diarrhea  . Metformin Diarrhea     DISCHARGE MEDICATIONS: Allergies as of 10/07/2018      Reactions   Canagliflozin Diarrhea   Dapagliflozin Nausea And Vomiting   UTI, yeast   Dulaglutide Diarrhea   Empagliflozin Diarrhea   Metformin Diarrhea      Medication List    STOP taking these medications   nitrofurantoin 100 MG capsule Commonly known as:  MACRODANTIN     TAKE these medications   albuterol 108 (90 Base) MCG/ACT inhaler Commonly known as:  PROVENTIL HFA;VENTOLIN HFA Inhale 2 puffs into the lungs every 6 (six) hours as needed for wheezing or shortness of breath.   aspirin EC 81 MG tablet Take 81 mg by mouth daily.   cephALEXin 500 MG capsule Commonly known as:  KEFLEX Take 1 capsule (500 mg total) by mouth 3 (three) times daily for 10 days.   fish oil-omega-3 fatty acids 1000 MG capsule Take 1 g by mouth 2 (two) times daily.   FREESTYLE LIBRE 14 DAY SENSOR Misc by Does not apply route.   glipiZIDE 10 MG 24 hr tablet Commonly known as:  GLUCOTROL XL Take 10 mg by mouth daily with breakfast.   insulin pump Soln Inject into the skin.  Insulin Pump- regulated by Dr Peri Jefferson in [place]   LEVEMIR FLEXTOUCH 100 UNIT/ML Pen Generic drug:  Insulin Detemir Inject 24-30 Units as directed See admin instructions. 30  units every morning, 24 units every night   lisinopril 10 MG tablet Commonly known as:  PRINIVIL,ZESTRIL Take 10 mg by mouth daily.   loperamide 2 MG tablet Commonly known as:  IMODIUM A-D Take 1 tablet (2 mg total) by mouth 3 (three) times daily as needed for diarrhea or loose stools.   MILK THISTLE PO Take 250 mg by mouth 2 (two) times daily.   MULTIVITAMIN WOMEN 50+ PO Take by mouth daily.   NOVOLOG 100 UNIT/ML injection Generic drug:  insulin aspart Inject 150 Units as directed See admin instructions. Inject 150 units into the skin daily for use in insulin pump   omeprazole 40 MG capsule Commonly known as:  PRILOSEC Take 40 mg by mouth daily.   pravastatin 40 MG tablet Commonly known as:  PRAVACHOL Take 40 mg by mouth daily.   promethazine 12.5 MG tablet Commonly known as:  PHENERGAN Take 1 tablet (12.5 mg total) by mouth every 6 (six) hours as needed for nausea or vomiting.   SUPER B COMPLEX PO Take 1 tablet by mouth daily.   traMADol 50 MG tablet Commonly known as:  ULTRAM Take 1 tablet (50 mg total) by mouth every 12 (twelve) hours as needed for up  to 5 days for severe pain.   vitamin C 1000 MG tablet Take 1,000 mg by mouth daily.   Vitamin D 50 MCG (2000 UT) Caps Take 1,000 Units by mouth daily.        Brief H and P: For complete details please refer to admission H and P, but in brief Patient is a 68 year old female with diabetes mellitus type 2 on insulin pump, retinopathy, neuropathy, hyperlipidemia presented with nausea, vomiting, abdominal discomfort with back pain.  Patient also reported low blood sugars at home.  Patient reported symptoms of UTI with dysuria, back pain and increased frequency 3 days ago.  Patient was started on Macrobid however had only 1 dose on the day of  admission.  While she was in [place], patient noticed her blood sugars were starting to run low and could not use insulin pump because of malfunctioning and turned it off.  Patient was admitted for acute pyelonephritis.  Hospital Course:   Acute pyelonephritis, sepsis, acute cystitis, E. coli bacteremia -Patient met sepsis criteria at the time of admission with tachycardia, fever, leukocytosis, elevated procalcitonin UTI -Blood cultures were positive for E. coli, urine culture positive for E. coli -CT abdomen consistent with acute cystitis -Patient was placed on IV Rocephin, transition to oral Keflex per sensitivities for 10 more days to complete full course.  QTC prolonged hence avoid fluoroquinolones.    Chronic diarrhea -Outpatient follow-up with GI -Colonoscopy in 2014 was normal, (Dr. Ardis Hughs ) was recommended cholestyramine for diarrhea    DM (diabetes mellitus), type 2 with complications ([place]) with hypoglycemia, brittle diabetes -Hemoglobin A1c 8.5 -Patient requested her home insulin regimen with Levemir and insulin pump to be continued in the hospital.  Patient and her daughter reported that she is being followed by endocrinologist who has been adjusting her medications. Will defer diabetes control to her endocrinologist.     Dehydration Patient was placed on IV fluid hydration, currently tolerating diet without any difficulty  Day of Discharge S: No fevers, feels better, left flank pain is improving.  Wants to go home  BP (!) 165/69 (BP Location: Right Arm)   Pulse (!) 107   Temp 99.5 F (37.5 C) (Oral)   Resp 20   Ht '5\' 4"'  (1.626 m)   Wt 108.9 kg   SpO2 93%   BMI 41.20 kg/m   Physical Exam: General: Alert and awake oriented x3 not in any acute distress. HEENT: anicteric sclera, pupils reactive to light and accommodation CVS: S1-S2 clear no murmur rubs or gallops Chest: clear to auscultation bilaterally, no wheezing rales or rhonchi Abdomen: soft nontender,  nondistended, normal bowel sounds, mild left CVAT Extremities: no cyanosis, clubbing or edema noted bilaterally Neuro: Cranial nerves II-XII intact, no focal neurological deficits   The results of significant diagnostics from this hospitalization (including imaging, microbiology, ancillary and laboratory) are listed below for reference.      Procedures/Studies:  Ct Abdomen Pelvis W Contrast  Result Date: 10/05/2018 CLINICAL DATA:  Pyelonephritis. Possible sepsis. EXAM: CT ABDOMEN AND PELVIS WITH CONTRAST TECHNIQUE: Multidetector CT imaging of the abdomen and pelvis was performed using the standard protocol following bolus administration of intravenous contrast. CONTRAST:  172m ISOVUE-300 IOPAMIDOL (ISOVUE-300) INJECTION 61% COMPARISON:  None. FINDINGS: Lower chest: Mild dependent changes in the lung bases. Hepatobiliary: Diffuse fatty infiltration of the liver. No focal liver lesions. Gallbladder is surgically absent. No bile duct dilatation. Pancreas: Unremarkable. No pancreatic ductal dilatation or surrounding inflammatory changes. Spleen: Normal in size without focal abnormality.  Adrenals/Urinary Tract: Kidneys are symmetrical in size and shape. Nephrograms are homogeneous and symmetrical. No hydronephrosis or hydroureter. Bladder wall is diffusely thickened, suggesting possible cystitis. No filling defects. Stomach/Bowel: Stomach is within normal limits. Appendix is surgically absent. No evidence of bowel wall thickening, distention, or inflammatory changes. Vascular/Lymphatic: Aortic atherosclerosis. No enlarged abdominal or pelvic lymph nodes. Reproductive: Status post hysterectomy. No adnexal masses. Other: Postoperative scarring along the midline anterior abdominal wall. No free air or free fluid in the abdomen. Musculoskeletal: Degenerative changes in the spine. No destructive bone lesions. IMPRESSION: 1. Diffuse fatty infiltration of the liver. 2. Bladder wall thickening may indicate cystitis.  3. No evidence of bowel obstruction or inflammation. Aortic Atherosclerosis (ICD10-I70.0). Electronically Signed   By: Lucienne Capers M.D.   On: 10/05/2018 01:33   Dg Chest Port 1 View  Result Date: 10/04/2018 CLINICAL DATA:  68 year old female with shortness of breath. EXAM: PORTABLE CHEST 1 VIEW COMPARISON:  Chest radiograph dated 11/27/2017 FINDINGS: Shallow inspiration. No focal consolidation, pleural effusion, or pneumothorax. Stable borderline cardiomegaly. No acute osseous pathology. Lower cervical fixation plate. IMPRESSION: No active disease. Electronically Signed   By: Anner Crete M.D.   On: 10/04/2018 23:17       LAB RESULTS: Basic Metabolic Panel: Recent Labs  Lab 10/05/18 0534 10/06/18 0454 10/07/18 0351  NA 138 136 141  K 4.0 3.6 4.3  CL 108 108 110  CO2 21* 21* 23  GLUCOSE 170* 178* 179*  BUN '15 14 11  ' CREATININE 1.11* 0.90 1.05*  CALCIUM 7.6* 7.9* 8.4*  MG 1.6*  --   --   PHOS 3.2  --   --    Liver Function Tests: Recent Labs  Lab 10/04/18 2219 10/05/18 0534  AST 63* 55*  ALT 52* 45*  ALKPHOS 109 82  BILITOT 1.0 1.0  PROT 7.1 5.6*  ALBUMIN 3.7 2.8*   Recent Labs  Lab 10/04/18 2219  LIPASE 30   No results for input(s): AMMONIA in the last 168 hours. CBC: Recent Labs  Lab 10/04/18 2219  10/06/18 0454 10/07/18 0351  WBC 5.0   < > 11.2* 10.3  NEUTROABS 3.9  --   --   --   HGB 13.5   < > 11.3* 11.2*  HCT 43.0   < > 37.7 36.3  MCV 96.6   < > 97.9 96.8  PLT 186   < > 178 198   < > = values in this interval not displayed.   Cardiac Enzymes: No results for input(s): CKTOTAL, CKMB, CKMBINDEX, TROPONINI in the last 168 hours. BNP: Invalid input(s): POCBNP CBG: Recent Labs  Lab 10/07/18 0301 10/07/18 0726  GLUCAP 139* 176*      Disposition and Follow-up: Discharge Instructions    Diet Carb Modified   Complete by:  As directed    Increase activity slowly   Complete by:  As directed        DISPOSITION: Home   DISCHARGE  FOLLOW-UP Follow-up Information    Aletha Halim., PA-C. Schedule an appointment as soon as possible for a visit in 2 week(s).   Specialty:  Family Medicine Contact information: [address] [place] [address] [phone]            Time coordinating discharge:  22mns    Signed:   REstill CottaM.D. [facility] 10/07/2018, 11:06 AM

## 2019-01-07 ENCOUNTER — Encounter: Payer: Self-pay | Admitting: Gastroenterology

## 2021-07-20 ENCOUNTER — Other Ambulatory Visit: Payer: Self-pay | Admitting: Otolaryngology

## 2021-07-20 DIAGNOSIS — R1314 Dysphagia, pharyngoesophageal phase: Secondary | ICD-10-CM

## 2021-07-21 ENCOUNTER — Ambulatory Visit
Admission: RE | Admit: 2021-07-21 | Discharge: 2021-07-21 | Disposition: A | Discharge status: Home / Self Care | Payer: Medicare Other | Source: Ambulatory Visit | Attending: Otolaryngology | Admitting: Otolaryngology

## 2021-07-21 DIAGNOSIS — R1314 Dysphagia, pharyngoesophageal phase: Secondary | ICD-10-CM

## 2021-11-28 ENCOUNTER — Ambulatory Visit: Payer: Medicare HMO | Attending: Family Medicine | Admitting: Speech Pathology

## 2021-11-28 DIAGNOSIS — R1310 Dysphagia, unspecified: Secondary | ICD-10-CM | POA: Insufficient documentation

## 2021-11-28 NOTE — Patient Instructions (Addendum)
I am recommended you get a Modified Barium Swallow Study. I will send the order to Dr. Constance Holster and then the hospital will call you to schedule appointment.  ? ?Following this x-ray, please call back to schedule a follow up with me. [phone] Myra Gianotti) ? ?Reflux Precautions: ?Remain upright 30 minutes after eating ?Avoid eating late at night ?Limit intake of foods that trigger reflux ? ?Aspiration Precautions: ?Sit at 90 degrees for all food/fluid intake ?Slow rate of intake ?Fully chew all foods ?Use liquid wash as needed ?Oral care at least 2x/day ?Small bites, single sips ?

## 2021-11-28 NOTE — Therapy (Signed)
?OUTPATIENT SPEECH LANGUAGE PATHOLOGY SWALLOW EVALUATION ? ? ?Patient Name: Ellen Mccoy ?MRN: [id number] ?DOB:[birth year], 71 y.o., female ?Today's Date: 11/28/2021 ? ?PCP: Aletha Halim., PA-C ?REFERRING PROVIDER: Izora Gala, MD  ? ? End of Session - 11/28/21 1617   ? ? Visit Number 1   ? Number of Visits 7   ? Date for SLP Re-Evaluation 01/09/22   ? Authorization Type BCBS   ? SLP Start Time 1530   ? SLP Stop Time  1610   ? SLP Time Calculation (min) 40 min   ? Activity Tolerance Patient tolerated treatment well   ? ?  ?  ? ?  ? ? ?Past Medical History:  ?Diagnosis Date  ? Blood transfusion without reported diagnosis 1978  ? after c section  ? Cataract   ? Depression   ? Diabetes mellitus   ? Diarrhea   ? GERD (gastroesophageal reflux disease)   ? Glaucoma   ? Hepatitis B   ? after blood transfusion  ? Hyperlipidemia   ? Hypertension   ? Obesity   ? ?Past Surgical History:  ?Procedure Laterality Date  ? ABDOMINAL HYSTERECTOMY    ? ANKLE SURGERY    ? APPENDECTOMY    ? not sure, may have been done with hysterectomy  ? CATARACT EXTRACTION W/ INTRAOCULAR LENS IMPLANT    ? CESAREAN SECTION    ? 2 c-sections  ? CHOLECYSTECTOMY    ? COLONOSCOPY    ? KNEE SURGERY    ? Bil  ? NECK SURGERY    ? TONSILLECTOMY    ? ?Patient Active Problem List  ? Diagnosis Date Noted  ? Sepsis ([place]) 10/05/2018  ? DM (diabetes mellitus), type 2 with complications ([place]) 32/67/1245  ? Hypoglycemia due to insulin 10/05/2018  ? Dehydration 10/05/2018  ? Acute pyelonephritis 10/05/2018  ? Chronic diarrhea 04/30/2014  ? Other dysphagia 04/30/2014  ? ? ?ONSET DATE: 6 months  ? ?REFERRING DIAG: R13.10 (ICD-10-CM) - Dysphagia, unspecified  ? ?THERAPY DIAG:  ?Dysphagia, unspecified type - Plan: SLP plan of care cert/re-cert, SLP modified barium swallow ? ?SUBJECTIVE:  ? ?SUBJECTIVE STATEMENT: ?"Feels like something is stuck in there like it's burning." ?Pt accompanied by: self ? ?PERTINENT HISTORY: Pt reports chronic coughing, choking, trouble  swallowing, burning in her throat. She takes Prilosec at nighttime to manage reflux. Swallow difficulties for past 6 months.  ? ?PAIN:  ?Are you having pain? Yes: NPRS scale: 3/10 ?Pain location: throat when swallowing ?Pain description: sore ?Aggravating factors: swallowing, coughing ?Relieving factors: nothing known ? ?FALLS: Has patient fallen in last 6 months?  No ? ?LIVING ENVIRONMENT: ?Lives with: lives with their family ?Lives in: House/apartment ? ?PLOF:  ?Level of assistance: Independent with ADLs, Independent with IADLs ?Employment: Retired ? ? ?PATIENT GOALS "not hurt when swallowing" ? ?OBJECTIVE:  ? ?DIAGNOSTIC FINDINGS: IMPRESSION: ?Findings of mild dysmotility of the esophagus. No focal narrowing on ?barium portion of the evaluation with transient arrest of the tablet ?in the distal esophagus of un certain significance perhaps due to ?mild narrowing at this level not visible on the barium portion of ?the examination and associated with globus. ?  ?ACDF with moderately large anterior osteophyte, at C3-4 which could ?impeed mildly the epiglottic inversion. There was visible stasis of ?the tablet in the vallecula and transient difficulty passing the ?level of the osteophyte, associated with symptoms during the ?examination. Dedicated swallow assessment may be helpful to ?determine next step if needed in management. ? ?COGNITION: ?Overall cognitive status: Within  functional limits for tasks assessed ? ?CLINICAL SWALLOW ASSESSMENT:   ?Current diet: regular ?Dentition: adequate natural dentition ?Patient directly observed with POs: Yes: regular, dysphagia 1 (puree), and thin liquids  ?Feeding: able to feed self ?Liquids provided by: cup, including sequential sips ?Oral phase signs and symptoms:  no overt s/sx ?Pharyngeal phase signs and symptoms: multiple swallows, audible swallow, wet vocal quality, complaints of globus, watery eyes, and changes in respirations ?Comments: Oral bolus trials of consistencies  noted above. Notable difficulty passing food during swallow, pt reports feeling like something is "blocking" the bolus on way down. Watery eyes and change in respiration observed with sequential sips of thin liquids, no coughing or throat clearing.  ? ? PATIENT REPORTED OUTCOME MEASURES (PROM): ?EAT-10: 28 ? ? ?TODAY'S TREATMENT:  ?Education provided on aspiration precautions, normal swallow function. Recommend MBSS. Explained test to pt, she is in agreement to have completed.  ? ? ?PATIENT EDUCATION: ?Education details: see above ?Person educated: Patient ?Education method: Explanation, Demonstration, and Handouts ?Education comprehension: verbalized understanding, returned demonstration, and needs further education ? ? ?ASSESSMENT: ? ?CLINICAL IMPRESSION: ?Patient is a 71 y.o. F who was seen today for dysphagia. Pt c/o feeling like something is blocking her swallow. Scores 28 on EAT-10. Is being followed by GI. Frequent throat clearing and coughing observed during session. S/sx aspiration observed with sequential sips of water. Overt difficulty swallowing all bolus trials administered this date. Recommend MBSS to objectively assess swallow function and safety prior to initiation of dysphagia treatment. Goals will be updated based on results of this objective assessment.  ? ?OBJECTIVE IMPAIRMENTS include dysphagia. These impairments are limiting patient from safety when swallowing. ?Factors affecting potential to achieve goals and functional outcome are co-morbidities. Patient will benefit from skilled SLP services to address above impairments and improve overall function. ? ?REHAB POTENTIAL: Good ? ? ?GOALS: ?Goals reviewed with patient? Yes ? ?SHORT TERM GOALS = LONG TERM GOALS D/T LENGTH OF POC: Target date: 01/09/2022 ? ?Pt will complete MBSS to objectively assess swallow function and safety.  ?Baseline: ?Goal status: INITIAL ? ?2.  Pt will report compliance with dysphagia HEP over 1 week period.  ?Baseline:   ?Goal status: INITIAL ? ? ? ?PLAN: ?SLP FREQUENCY: 1x/week ? ?SLP DURATION: 6 weeks ? ?PLANNED INTERVENTIONS: Aspiration precaution training, Pharyngeal strengthening exercises, Diet toleration management , SLP instruction and feedback, Compensatory strategies, and Patient/family education ? ? ? ?Su Monks, CCC-SLP ?11/28/2021, 4:18 PM ? ? ? ?  ?

## 2021-11-29 ENCOUNTER — Telehealth (HOSPITAL_COMMUNITY): Payer: Self-pay

## 2021-11-29 ENCOUNTER — Other Ambulatory Visit (HOSPITAL_COMMUNITY): Payer: Self-pay

## 2021-11-29 DIAGNOSIS — R1310 Dysphagia, unspecified: Secondary | ICD-10-CM

## 2021-11-29 NOTE — Telephone Encounter (Signed)
Attempted to contact patient to schedule OP MBS - left voicemail. ?

## 2021-12-04 ENCOUNTER — Ambulatory Visit (HOSPITAL_COMMUNITY)
Admission: RE | Admit: 2021-12-04 | Discharge: 2021-12-04 | Disposition: A | Discharge status: Home / Self Care | Payer: Medicare HMO | Source: Ambulatory Visit | Attending: Otolaryngology | Admitting: Otolaryngology

## 2021-12-04 DIAGNOSIS — R1310 Dysphagia, unspecified: Secondary | ICD-10-CM | POA: Insufficient documentation

## 2021-12-04 NOTE — Progress Notes (Signed)
Modified Barium Swallow Progress Note ? ?Patient Details  ?Name: Ellen Mccoy ?MRN: [id number] ?Date of Birth: 04/26/1951 ? ?Today's Date: 12/04/2021 ? ?Modified Barium Swallow completed.  Full report located under Chart Review in the Imaging Section. ? ?Brief recommendations include the following: ? ?Clinical Impression ? Pt appears to have a mild, structurally based dysphagia. Oral phase is functional. Pharyngeal phase is impacted by presence of cervical osteophyte (confirmed by prior imaging report) at C3-4, which sits above her cervical hardware from prior ACDF. Question if both could be contributing to reduced UES opening, although osteophyte specifically seems to limit full epiglottic inversion. Throughout the study pt reported feeling like she needed to swallow two times per bolus, although she had pretty good pharyngeal clearance. Minimal residue remained in the valleculae with a bigger bite of graham cracker and the barium tablet was stopped at the UES, but her reflexive second swallows cleared both well. (unfortunately, these loops were not saved). Note that thin liquids appeared to also backflow into the pharynx as she tried to swallow the pill. Question if she could have an esophageal component that is also contributing to her symptoms (says she is currently taking medication for GERD). Would continue with regular solids and thin liquids as tolerated. Could consider additional f/u with SLP to maximize pharyngeal function as pt does have more chronic structural components for which she has to compensate, and she has potential for this to decline in the future in the setting of other acute events. Will defer any f/u to her OP SLP. ?  ?Swallow Evaluation Recommendations ? ? Recommended Consults: Consider GI evaluation ? ? SLP Diet Recommendations: Regular solids;Thin liquid ? ? Liquid Administration via: Cup;Straw ? ? Medication Administration: Whole meds with liquid ? ? Supervision: Patient able to self  feed ? ? Compensations: Slow rate;Small sips/bites;Follow solids with liquid ? ? Postural Changes: Seated upright at 90 degrees;Remain semi-upright after after feeds/meals (Comment) ? ? Oral Care Recommendations: Oral care BID ? ?   ? ? ? ?Osie Bond., M.A. CCC-SLP ?Acute Rehabilitation Services ?Office [phone] ? ?Secure chat preferred ? ?12/04/2021,12:53 PM ?

## 2022-01-24 ENCOUNTER — Encounter: Payer: Self-pay | Admitting: Gastroenterology

## 2022-03-02 ENCOUNTER — Encounter: Payer: Self-pay | Admitting: Gastroenterology

## 2022-03-02 ENCOUNTER — Ambulatory Visit: Payer: Medicare HMO | Admitting: Gastroenterology

## 2022-03-02 VITALS — BP 122/60 | HR 96 | Ht 64.0 in | Wt 259.4 lb

## 2022-03-02 DIAGNOSIS — R1319 Other dysphagia: Secondary | ICD-10-CM

## 2022-03-02 MED ORDER — FAMOTIDINE 20 MG PO TABS: 20.0000 mg | ORAL_TABLET | Freq: Every day | ORAL | 3 refills | Status: DC

## 2022-03-02 NOTE — Patient Instructions (Addendum)
If you are age 72 or older, your body mass index should be between 23-30. Your Body mass index is 44.52 kg/m. If this is out of the aforementioned range listed, please consider follow up with your Primary Care Provider. ________________________________________________________  The [facility] providers would like to encourage you to use [facility] to communicate with providers for non-urgent requests or questions.  Due to long hold times on the telephone, sending your provider a message by [facility] may be a faster and more efficient way to get a response.  Please allow 48 business hours for a response.  Please remember that this is for non-urgent requests.  _______________________________________________________  We have sent the following medications to your pharmacy for you to pick up at your convenience:  START: Pepcid (famotidine) '20mg'$  one tablet each night at bedtime.  If you decide to have surveillance colonoscopy, please call our office to schedule.  Please note that the osteophyte in your neck can not be helped endoscopically.  Thank you for entrusting me with your care and choosing [facility].  Dr Ardis Hughs

## 2022-03-02 NOTE — Progress Notes (Signed)
Review of pertinent gastrointestinal problems: 1. Adenomatous polyps: 12/2007 colonoscopy Ardis Hughs done for diarrhea; found two polyps (one was >1cm), recommended recall at 3 years. Reminder letters mailed twice and eventual Repeat colonoscopy 07/2013 Dr. Ardis Hughs found two polyps, one was 26m, both adenomas, she was recommended to have recall colonoscopy at 3 year interval.  2. Chronic diarrhea, colonoscopy above, random colon biopsies were normal.  Symptoms seemed to have started after GB removal.  In 2009 I recommended cholestyramine trial and it helped however when she ran out of her prescription she never called back to refill it.  3. Dysphagia workup 04/2014, EGD was normal.  She was recommended to eat slowly, take small bites and chew very well.  Barium esophagram ordered by ENT physician Dr. 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 No focal narrowing on barium portion of the evaluation with transient arrest of the tablet in the distal esophagus of un certain significance perhaps due to mild narrowing at this level not visible on the barium portion of the examination and associated with globus. ACDF with moderately large anterior osteophyte, at C3-4 which could impeed mildly the epiglottic inversion. There was visible stasis of the tablet in the vallecula and transient difficulty passing the level of the osteophyte, associated with symptoms during the examination. 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  Question if both could be contributing to reduced upper esophageal sphincter opening, although osteophyte seems to limit full epiglottic opening."    HPI: This is a very pleasant 71year old woman who was referred to me by RIzora Gala  MD  to evaluate dysphagia.     She was last here in our office almost 4 years ago with diarrhea.  Her weight is up almost 20 pounds since that visit, same scale.  She now has a BMI of almost 45  Hemoglobin A1c 01/2022 was 7.4  Blood work 10/2021 showed normal CBC and normal complete metabolic profile  She has the feeling of food hanging catching in her upper throat with just about every bite.  She has globus nearly all the time.  She takes omeprazole every morning shortly before breakfast and despite that she still has pyrosis throughout the nighttime hours usually.  She is not losing weight in fact she has gained about 20 pounds in the past few years  Review of systems: Pertinent positive and negative review of systems were noted in the above HPI section. All other review negative.   Past Medical History:  Diagnosis Date   Anxiety    Blood transfusion without reported diagnosis 1978   after c section   Cataract    Colon polyp    Depression    Diabetes mellitus    Diarrhea    GERD (gastroesophageal reflux disease)    Glaucoma    Hepatitis B    after blood transfusion   Hyperlipidemia    Hypertension    Obesity     Past Surgical History:  Procedure Laterality Date   ABDOMINAL HYSTERECTOMY     ANKLE SURGERY     APPENDECTOMY     not sure, may have been done with hysterectomy   CATARACT EXTRACTION W/ INTRAOCULAR LENS IMPLANT     CESAREAN SECTION     2 c-sections   CHOLECYSTECTOMY  COLONOSCOPY     KNEE SURGERY     Bil   NECK SURGERY     TONSILLECTOMY      Current Outpatient Medications  Medication Instructions   albuterol (PROVENTIL HFA;VENTOLIN HFA) 108 (90 Base) MCG/ACT inhaler 2 puffs, Inhalation, Every 6 hours PRN   aspirin EC 81 mg, Daily   B Complex-C (SUPER B COMPLEX PO) 1 tablet, Daily   Continuous Blood Gluc Sensor (FREESTYLE LIBRE 14 DAY SENSOR) MISC Does not apply   fish oil-omega-3 fatty acids 1 g, 2 times daily   glipiZIDE (GLUCOTROL XL) 10 mg,  Oral, Daily with breakfast   Insulin Human (INSULIN PUMP) SOLN Subcutaneous, Insulin Pump- regulated by Dr Peri Jefferson in [place]   Levemir FlexTouch 24-30 Units, Injection, See admin instructions, 30  units every morning, 24 units every night   lisinopril (ZESTRIL) 10 mg, Oral, Daily   loperamide (IMODIUM A-D) 2 mg, Oral, 3 times daily PRN   MILK THISTLE PO 250 mg, 2 times daily   Multiple Vitamins-Minerals (MULTIVITAMIN WOMEN 50+ PO) Oral, Daily   NovoLOG 150 Units, Injection, See admin instructions, Inject 150 units into the skin daily for use in insulin pump   omeprazole (PRILOSEC) 40 mg, Oral, Daily   pravastatin (PRAVACHOL) 40 mg, Oral, Daily   promethazine (PHENERGAN) 12.5 mg, Oral, Every 6 hours PRN   vitamin C 1,000 mg, Daily   Vitamin D 1,000 Units, Oral, Daily    Allergies as of 03/02/2022 - Review Complete 03/02/2022  Allergen Reaction Noted   Canagliflozin Diarrhea 10/10/2016   Dapagliflozin Nausea And Vomiting 10/10/2016   Dulaglutide Diarrhea 05/09/2018   Empagliflozin Diarrhea 05/09/2018   Metformin Diarrhea 05/09/2018    Family History  Problem Relation Age of Onset   Diabetes Mother    Cancer Father    Diabetes Father    Squamous cell carcinoma Father    Diabetes Brother    Diabetes Maternal Grandfather    Diabetes Paternal Grandmother    Colon cancer Cousin    Esophageal cancer Neg Hx     Social History   Socioeconomic History   Marital status: Divorced    Spouse name: Not on file   Number of children: 2   Years of education: Not on file   Highest education level: Not on file  Occupational History   Not on file  Tobacco Use   Smoking status: Never   Smokeless tobacco: Never  Vaping Use   Vaping Use: Never used  Substance and Sexual Activity   Alcohol use: No   Drug use: No   Sexual activity: Not on file  Other Topics Concern   Not on file  Social History Narrative   Not on file   Social Determinants of Health   Financial Resource Strain:  Not on file  Food Insecurity: Not on file  Transportation Needs: Not on file  Physical Activity: Not on file  Stress: Not on file  Social Connections: Not on file  Intimate Partner Violence: Not on file     Physical Exam: Ht '5\' 4"'$  (1.626 m)   Wt 259 lb 6 oz (117.7 kg)   BMI 44.52 kg/m  Constitutional: generally well-appearing Psychiatric: alert and oriented x3 Eyes: extraocular movements intact Mouth: oral pharynx moist, no lesions Neck: supple no lymphadenopathy Cardiovascular: heart regular rate and rhythm Lungs: clear to auscultation bilaterally Abdomen: soft, nontender, nondistended, no obvious ascites, no peritoneal signs, normal bowel sounds Extremities: no lower extremity edema bilaterally Skin: no lesions on visible extremities  Assessment and plan: 71 y.o. 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  This is not something that can be helped endoscopically.  I recommended she continue chewing her food well eating slowly and taking small bites.  She does have GERD especially at night with pyrosis despite proton pump inhibitor once daily.  I recommended she start taking a Pepcid 20 mg pill every night at bedtime and that we will probably alleviate her overnight symptoms.  We discussed the fact that she is "overdue" for surveillance colonoscopy for personal history of polyps.  She is not interested in scheduling that now.  She will call back if she changes her mind.  Please see the "Patient Instructions" section for addition details about the plan.   Owens Loffler, MD [facility] 03/02/2022, 1:25 PM  Cc: Izora Gala, MD  Total time on date of encounter was 45  minutes (this included time spent preparing to see the patient reviewing records; obtaining and/or reviewing separately obtained history; performing a medically appropriate exam and/or  evaluation; counseling and educating the patient and family if present; ordering medications, tests or procedures if applicable; and documenting clinical information in the health record).

## 2022-06-01 ENCOUNTER — Other Ambulatory Visit: Payer: Self-pay | Admitting: Gastroenterology

## 2022-06-01 NOTE — Telephone Encounter (Signed)
Yes okay to refill. Thanks 

## 2022-07-03 ENCOUNTER — Other Ambulatory Visit (HOSPITAL_BASED_OUTPATIENT_CLINIC_OR_DEPARTMENT_OTHER): Payer: Self-pay

## 2022-07-03 MED ORDER — OZEMPIC (2 MG/DOSE) 8 MG/3ML ~~LOC~~ SOPN
2.0000 mg | PEN_INJECTOR | SUBCUTANEOUS | 5 refills | Status: AC
  Filled 2022-07-03: qty 3, 28d supply, fill #0

## 2023-04-18 ENCOUNTER — Other Ambulatory Visit (HOSPITAL_BASED_OUTPATIENT_CLINIC_OR_DEPARTMENT_OTHER): Payer: Self-pay
